# Patient Record
Sex: Female | Born: 1975
Health system: Southern US, Community
[De-identification: ages and names within clinical notes are randomized; demographics above are authoritative.]

## PROBLEM LIST (undated history)

## (undated) DIAGNOSIS — T7840XA Allergy, unspecified, initial encounter: Secondary | ICD-10-CM

## (undated) DIAGNOSIS — G4730 Sleep apnea, unspecified: Secondary | ICD-10-CM

## (undated) DIAGNOSIS — E7800 Pure hypercholesterolemia, unspecified: Secondary | ICD-10-CM

## (undated) DIAGNOSIS — E119 Type 2 diabetes mellitus without complications: Secondary | ICD-10-CM

## (undated) DIAGNOSIS — I10 Essential (primary) hypertension: Secondary | ICD-10-CM

## (undated) DIAGNOSIS — D573 Sickle-cell trait: Secondary | ICD-10-CM

## (undated) HISTORY — DX: Allergy, unspecified, initial encounter: T78.40XA

## (undated) HISTORY — DX: Sickle-cell trait: D57.3

## (undated) HISTORY — DX: Type 2 diabetes mellitus without complications: E11.9

## (undated) HISTORY — DX: Pure hypercholesterolemia, unspecified: E78.00

## (undated) HISTORY — PX: WISDOM TOOTH EXTRACTION: SHX21

## (undated) HISTORY — DX: Sleep apnea, unspecified: G47.30

---
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
## 1998-09-17 ENCOUNTER — Emergency Department (HOSPITAL_COMMUNITY): Admission: EM | Admit: 1998-09-17 | Discharge: 1998-09-17 | Payer: Self-pay | Admitting: Emergency Medicine

## 1998-09-23 ENCOUNTER — Other Ambulatory Visit: Admission: RE | Admit: 1998-09-23 | Discharge: 1998-09-23 | Payer: Self-pay | Admitting: Obstetrics and Gynecology

## 2000-08-24 ENCOUNTER — Encounter (HOSPITAL_BASED_OUTPATIENT_CLINIC_OR_DEPARTMENT_OTHER): Payer: Self-pay | Admitting: General Surgery

## 2000-08-24 ENCOUNTER — Encounter: Admission: RE | Admit: 2000-08-24 | Discharge: 2000-08-24 | Payer: Self-pay | Admitting: General Surgery

## 2001-02-22 ENCOUNTER — Encounter (HOSPITAL_BASED_OUTPATIENT_CLINIC_OR_DEPARTMENT_OTHER): Payer: Self-pay | Admitting: General Surgery

## 2001-02-22 ENCOUNTER — Encounter: Admission: RE | Admit: 2001-02-22 | Discharge: 2001-02-22 | Payer: Self-pay | Admitting: General Surgery

## 2001-08-29 ENCOUNTER — Other Ambulatory Visit: Admission: RE | Admit: 2001-08-29 | Discharge: 2001-08-29 | Payer: Self-pay | Admitting: Family Medicine

## 2002-01-23 ENCOUNTER — Encounter: Admission: RE | Admit: 2002-01-23 | Discharge: 2002-01-23 | Payer: Self-pay | Admitting: Family Medicine

## 2002-01-23 ENCOUNTER — Encounter: Payer: Self-pay | Admitting: Family Medicine

## 2008-08-04 ENCOUNTER — Emergency Department (HOSPITAL_COMMUNITY): Admission: EM | Admit: 2008-08-04 | Discharge: 2008-08-04 | Payer: Self-pay | Admitting: Emergency Medicine

## 2008-10-29 ENCOUNTER — Emergency Department (HOSPITAL_COMMUNITY): Admission: EM | Admit: 2008-10-29 | Discharge: 2008-10-29 | Payer: Self-pay | Admitting: Emergency Medicine

## 2009-07-10 ENCOUNTER — Inpatient Hospital Stay (HOSPITAL_COMMUNITY): Admission: AD | Admit: 2009-07-10 | Discharge: 2009-07-10 | Payer: Self-pay | Admitting: Obstetrics and Gynecology

## 2009-10-26 ENCOUNTER — Inpatient Hospital Stay (HOSPITAL_COMMUNITY): Admission: AD | Admit: 2009-10-26 | Discharge: 2009-10-26 | Payer: Self-pay | Admitting: Obstetrics and Gynecology

## 2009-12-05 ENCOUNTER — Inpatient Hospital Stay (HOSPITAL_COMMUNITY): Admission: AD | Admit: 2009-12-05 | Discharge: 2009-12-05 | Payer: Self-pay | Admitting: Obstetrics and Gynecology

## 2009-12-26 ENCOUNTER — Inpatient Hospital Stay (HOSPITAL_COMMUNITY): Admission: AD | Admit: 2009-12-26 | Discharge: 2009-12-29 | Payer: Self-pay | Admitting: Obstetrics and Gynecology

## 2010-05-11 ENCOUNTER — Emergency Department (HOSPITAL_COMMUNITY): Admission: EM | Admit: 2010-05-11 | Discharge: 2010-05-11 | Payer: Self-pay | Admitting: Family Medicine

## 2010-09-27 LAB — URINALYSIS, MICROSCOPIC ONLY
Bilirubin Urine: NEGATIVE
Leukocytes, UA: NEGATIVE
Nitrite: NEGATIVE
RBC / HPF: NONE SEEN RBC/hpf (ref <3)
Specific Gravity, Urine: 1.02 (ref 1.005–1.030)
Urobilinogen, UA: 0.2 mg/dL (ref 0.0–1.0)
pH: 6 (ref 5.0–8.0)

## 2010-09-27 LAB — COMPREHENSIVE METABOLIC PANEL
ALT: 15 U/L (ref 0–35)
AST: 16 U/L (ref 0–37)
Alkaline Phosphatase: 238 U/L — ABNORMAL HIGH (ref 39–117)
CO2: 23 mEq/L (ref 19–32)
Calcium: 9.1 mg/dL (ref 8.4–10.5)
Chloride: 107 mEq/L (ref 96–112)
GFR calc Af Amer: >60 mL/min (ref >60)
GFR calc non Af Amer: >60 mL/min (ref >60)
Glucose, Bld: 130 mg/dL — ABNORMAL HIGH (ref 70–99)
Sodium: 136 mEq/L (ref 135–145)
Total Bilirubin: 0.5 mg/dL (ref 0.3–1.2)

## 2010-09-27 LAB — CBC
HCT: 31 % — ABNORMAL LOW (ref 36.0–46.0)
HCT: 37.4 % (ref 36.0–46.0)
Hemoglobin: 12.1 g/dL (ref 12.0–15.0)
Hemoglobin: 12.9 g/dL (ref 12.0–15.0)
MCHC: 33.8 g/dL (ref 30.0–36.0)
MCHC: 33.8 g/dL (ref 30.0–36.0)
MCHC: 34.3 g/dL (ref 30.0–36.0)
MCV: 92.3 fL (ref 78.0–100.0)
Platelets: 101 10*3/uL — ABNORMAL LOW (ref 150–400)
Platelets: 112 10*3/uL — ABNORMAL LOW (ref 150–400)
Platelets: 73 10*3/uL — ABNORMAL LOW (ref 150–400)
RBC: 4.13 MIL/uL (ref 3.87–5.11)
RDW: 14.8 % (ref 11.5–15.5)
RDW: 14.9 % (ref 11.5–15.5)
RDW: 15 % (ref 11.5–15.5)
WBC: 12.2 10*3/uL — ABNORMAL HIGH (ref 4.0–10.5)
WBC: 12.4 10*3/uL — ABNORMAL HIGH (ref 4.0–10.5)
WBC: 15.8 10*3/uL — ABNORMAL HIGH (ref 4.0–10.5)

## 2010-09-27 LAB — LACTATE DEHYDROGENASE: LDH: 118 U/L (ref 94–250)

## 2010-09-28 LAB — URINALYSIS, ROUTINE W REFLEX MICROSCOPIC
Bilirubin Urine: NEGATIVE
Glucose, UA: NEGATIVE mg/dL
Hgb urine dipstick: NEGATIVE
Ketones, ur: NEGATIVE mg/dL
Nitrite: NEGATIVE
Protein, ur: NEGATIVE mg/dL
Specific Gravity, Urine: 1.01 (ref 1.005–1.030)
Urobilinogen, UA: 0.2 mg/dL (ref 0.0–1.0)
pH: 6.5 (ref 5.0–8.0)

## 2010-09-28 LAB — CBC
HCT: 35.6 % — ABNORMAL LOW (ref 36.0–46.0)
Hemoglobin: 12.2 g/dL (ref 12.0–15.0)
MCHC: 34.4 g/dL (ref 30.0–36.0)
MCV: 91.6 fL (ref 78.0–100.0)
Platelets: 110 K/uL — ABNORMAL LOW (ref 150–400)
RBC: 3.89 MIL/uL (ref 3.87–5.11)
RDW: 14.6 % (ref 11.5–15.5)
WBC: 10.3 K/uL (ref 4.0–10.5)

## 2010-09-28 LAB — COMPREHENSIVE METABOLIC PANEL
AST: 20 U/L (ref 0–37)
Albumin: 3 g/dL — ABNORMAL LOW (ref 3.5–5.2)
Calcium: 9.2 mg/dL (ref 8.4–10.5)
Creatinine, Ser: 0.55 mg/dL (ref 0.4–1.2)
GFR calc Af Amer: >60 mL/min (ref >60)
Sodium: 135 mEq/L (ref 135–145)
Total Protein: 5.9 g/dL — ABNORMAL LOW (ref 6.0–8.3)

## 2010-09-28 LAB — LACTATE DEHYDROGENASE: LDH: 109 U/L (ref 94–250)

## 2010-09-29 LAB — RH IMMUNE GLOBULIN WORKUP (NOT WOMEN'S HOSP)
ABO/RH(D): O NEG
Antibody Screen: NEGATIVE

## 2010-10-12 LAB — URINE CULTURE: Colony Count: 60000

## 2010-10-12 LAB — URINALYSIS, ROUTINE W REFLEX MICROSCOPIC
Glucose, UA: NEGATIVE mg/dL
Ketones, ur: NEGATIVE mg/dL
pH: 6.5 (ref 5.0–8.0)

## 2010-10-12 LAB — WET PREP, GENITAL: Yeast Wet Prep HPF POC: NONE SEEN

## 2010-10-12 LAB — CBC
HCT: 34.6 % — ABNORMAL LOW (ref 36.0–46.0)
Hemoglobin: 11.6 g/dL — ABNORMAL LOW (ref 12.0–15.0)
Platelets: 154 10*3/uL (ref 150–400)
RBC: 3.75 MIL/uL — ABNORMAL LOW (ref 3.87–5.11)
WBC: 11 10*3/uL — ABNORMAL HIGH (ref 4.0–10.5)

## 2010-10-12 LAB — ABO/RH: ABO/RH(D): O NEG

## 2010-10-12 LAB — RH IMMUNE GLOBULIN WORKUP (NOT WOMEN'S HOSP)

## 2010-10-12 LAB — GC/CHLAMYDIA PROBE AMP, GENITAL: GC Probe Amp, Genital: NEGATIVE

## 2010-10-12 LAB — TYPE AND SCREEN

## 2012-07-11 ENCOUNTER — Ambulatory Visit: Payer: Self-pay | Admitting: Obstetrics and Gynecology

## 2012-08-08 ENCOUNTER — Ambulatory Visit: Payer: BC Managed Care – PPO | Admitting: Obstetrics and Gynecology

## 2012-08-08 ENCOUNTER — Encounter: Payer: Self-pay | Admitting: Obstetrics and Gynecology

## 2012-08-08 VITALS — BP 118/74 | Ht 66.0 in | Wt 206.0 lb

## 2012-08-08 DIAGNOSIS — O1002 Pre-existing essential hypertension complicating childbirth: Secondary | ICD-10-CM | POA: Insufficient documentation

## 2012-08-08 DIAGNOSIS — F53 Postpartum depression: Secondary | ICD-10-CM

## 2012-08-08 DIAGNOSIS — Z01419 Encounter for gynecological examination (general) (routine) without abnormal findings: Secondary | ICD-10-CM

## 2012-08-08 NOTE — Progress Notes (Signed)
Last Pap: 04/21/2010 WNL: Yes Regular Periods:yes Contraception: none  Monthly Breast exam:yes Tetanus<34yrs:yes Nl.Bladder Function:yes Daily BMs:no Healthy Diet:yes Calcium:yes Mammogram:yes Date of Mammogram: 2002 Exercise:no Seatbelt: yes Abuse at home: no Stressful work:no PCP: Dr.Karimova  Change in PMH: unchanged Change in ZOX:[id number]  Pt stated no issues today.

## 2012-08-08 NOTE — Progress Notes (Signed)
Subjective:    Penny Williamson is a 37 y.o. female, G2P2002, who presents for an annual exam.   Patient reports:   Doing well.  PP depression resolved.  Not on birth control, but "being careful".  Wants to restart OCPs (past user), declines Depo due to increased weight gain with past use.    History   Social History  . Marital Status: Single    Spouse Name: N/A    Number of Children: N/A  . Years of Education: N/A   Social History Main Topics  . Smoking status: Current Every Day Smoker    Types: Cigarettes  . Smokeless tobacco: Never Used  . Alcohol Use: 1.5 oz/week    3 drink(s) per week  . Drug Use: No  . Sexually Active: Yes    Birth Control/ Protection: None   Other Topics Concern  . None   Social History Narrative  . None    Menstrual cycle:   LMP: Patient's last menstrual period was 07/01/2012.           Cycle: WNL, no IM bleeding  The following portions of the patient's history were reviewed and updated as appropriate: allergies, current medications, past family history, past medical history, past social history, past surgical history and problem list.  Review of Systems Pertinent items are noted in HPI. Breast:Negative for breast lump,nipple discharge or nipple retraction Gastrointestinal: Negative for abdominal pain, change in bowel habits or rectal bleeding Urinary:negative   Objective:    BP 118/74  Ht 5\' 6"  (1.676 m)  Wt 206 lb (93.441 kg)  BMI 33.25 kg/m2  LMP 07/01/2012    Weight:  Wt Readings from Last 1 Encounters:  08/08/12 206 lb (93.441 kg)          BMI: Body mass index is 33.25 kg/(m^2).  General Appearance: Alert, appropriate appearance for age. No acute distress HEENT: Grossly normal Neck / Thyroid: Supple, no masses, nodes or enlargement Lungs: clear to auscultation bilaterally Back: No CVA tenderness Breast Exam: No masses or nodes.No dimpling, nipple retraction or discharge. Cardiovascular: Regular rate and rhythm. S1, S2, no  murmur Gastrointestinal: Soft, non-tender, no masses or organomegaly Pelvic Exam: Vulva and vagina appear normal. Bimanual exam reveals normal uterus and adnexa. Rectovaginal: normal rectal, no masses Lymphatic Exam: Non-palpable nodes in neck, clavicular, axillary, or inguinal regions  Skin: no rash or abnormalities Neurologic: Normal gait and speech, no tremor  Psychiatric: Alert and oriented, appropriate affect.   Wet Prep:not applicable Urinalysis:not applicable UPT: Not done   Assessment:    Normal gyn exam Contraceptive management  Chronic hypertension--stable on HCTZ   Plan:    Mammogram: Age 16 Pap:  Done today STD screening: declined Contraception:no method--wants OCP trial.  3 months of Lo Loestrin 24 given.  Patient will f/u by phone in 3 months with update--may rx x 1 year if wants to continue. Other:  Continue to f/u with primary at [facility] at [facility] prn      Nyra Capes, [place]

## 2012-08-09 LAB — PAP IG W/ RFLX HPV ASCU

## 2014-05-13 ENCOUNTER — Encounter: Payer: Self-pay | Admitting: Obstetrics and Gynecology

## 2014-07-29 ENCOUNTER — Emergency Department (HOSPITAL_COMMUNITY)
Admission: EM | Admit: 2014-07-29 | Discharge: 2014-07-29 | Disposition: A | Discharge status: Home / Self Care | Payer: No Typology Code available for payment source | Attending: Emergency Medicine | Admitting: Emergency Medicine

## 2014-07-29 ENCOUNTER — Encounter (HOSPITAL_COMMUNITY): Payer: Self-pay | Admitting: *Deleted

## 2014-07-29 DIAGNOSIS — Z880 Allergy status to penicillin: Secondary | ICD-10-CM | POA: Insufficient documentation

## 2014-07-29 DIAGNOSIS — M79605 Pain in left leg: Secondary | ICD-10-CM | POA: Diagnosis present

## 2014-07-29 DIAGNOSIS — Z79899 Other long term (current) drug therapy: Secondary | ICD-10-CM | POA: Insufficient documentation

## 2014-07-29 DIAGNOSIS — I10 Essential (primary) hypertension: Secondary | ICD-10-CM | POA: Insufficient documentation

## 2014-07-29 DIAGNOSIS — Z720 Tobacco use: Secondary | ICD-10-CM | POA: Insufficient documentation

## 2014-07-29 DIAGNOSIS — M5432 Sciatica, left side: Secondary | ICD-10-CM | POA: Diagnosis not present

## 2014-07-29 HISTORY — DX: Essential (primary) hypertension: I10

## 2014-07-29 MED ORDER — OXYCODONE-ACETAMINOPHEN 5-325 MG PO TABS
2.0000 | ORAL_TABLET | Freq: Once | ORAL | Status: AC
  Administered 2014-07-29: 2 via ORAL
  Filled 2014-07-29: qty 2

## 2014-07-29 MED ORDER — OXYCODONE-ACETAMINOPHEN 5-325 MG PO TABS: 1.0000 | ORAL_TABLET | ORAL | Status: DC | PRN

## 2014-07-29 MED ORDER — KETOROLAC TROMETHAMINE 60 MG/2ML IM SOLN
60.0000 mg | Freq: Once | INTRAMUSCULAR | Status: AC
  Administered 2014-07-29: 60 mg via INTRAMUSCULAR
  Filled 2014-07-29: qty 2

## 2014-07-29 MED ORDER — IBUPROFEN 600 MG PO TABS: 600.0000 mg | ORAL_TABLET | Freq: Three times a day (TID) | ORAL | Status: DC | PRN

## 2014-07-29 MED ORDER — DIAZEPAM 5 MG PO TABS
5.0000 mg | ORAL_TABLET | Freq: Once | ORAL | Status: AC
  Administered 2014-07-29: 5 mg via ORAL
  Filled 2014-07-29: qty 1

## 2014-07-29 MED ORDER — METHOCARBAMOL 500 MG PO TABS: 500.0000 mg | ORAL_TABLET | Freq: Three times a day (TID) | ORAL | Status: DC | PRN

## 2014-07-29 NOTE — ED Notes (Signed)
Pt alert & oriented x4, stable gait. Patient given discharge instructions, paperwork & prescription(s). Patient  instructed to stop at the registration desk to finish any additional paperwork. Patient verbalized understanding. Pt left department w/ no further questions. 

## 2014-07-29 NOTE — ED Notes (Signed)
PB, Crackers and ginger ale provided; MD ok

## 2014-07-29 NOTE — ED Provider Notes (Signed)
CSN: [id number]     Arrival date & time 07/29/14  0803 History  This chart was scribed for Hoy Morn, MD by Edison Simon, ED Scribe. This patient was seen in room APA07/APA07 and the patient's care was started at 9:03 AM.    Chief Complaint  Patient presents with  . Leg Pain   The history is provided by the patient. No language interpreter was used.    HPI Comments: Penny Williamson is a 39 y.o. female who presents to the Emergency Department complaining of pain to left lower back radiating through buttock to left leg with onset 2 days ago. She reports associated intermittent numbness in toes on left foot. She reports similar pain before. She states she has not been evaluated for this before. States she has tried Ibuprofen and Tylenol without improvement. She denies trauma or injury. She denies weakness in her legs.  PCP: Milana Obey, MD at [facility]  Past Medical History  Diagnosis Date  . Allergy   . Hypertension    Past Surgical History  Procedure Laterality Date  . Wisdom tooth extraction     Family History  Problem Relation Age of Onset  . Lupus Mother   . Heart disease Maternal Grandmother   . Diabetes Maternal Grandmother    History  Substance Use Topics  . Smoking status: Current Every Day Smoker    Types: Cigarettes  . Smokeless tobacco: Never Used  . Alcohol Use: 1.5 oz/week    3 Not specified per week   OB History    Gravida Para Term Preterm AB TAB SAB Ectopic Multiple Living   2 2 2       2      Review of Systems A complete 10 system review of systems was obtained and all systems are negative except as noted in the HPI and PMH.    Allergies  Penicillins  Home Medications   Prior to Admission medications   Medication Sig Start Date End Date Taking? Authorizing Provider  triamterene-hydrochlorothiazide (DYAZIDE) 37.5-25 MG per capsule Take 1 capsule by mouth daily. 07/02/14  Yes Historical Provider, MD   BP 130/87 mmHg  Pulse 104  Temp(Src) 97.9  F (36.6 C) (Oral)  Resp 18  Ht 5' 5.5" (1.664 m)  Wt 170 lb (77.111 kg)  BMI 27.85 kg/m2  SpO2 99%  LMP 07/29/2014 Physical Exam  Constitutional: She is oriented to person, place, and time. She appears well-developed and well-nourished.  HENT:  Head: Normocephalic.  Eyes: EOM are normal.  Neck: Normal range of motion.  Pulmonary/Chest: Effort normal.  Abdominal: She exhibits no distension.  Musculoskeletal: Normal range of motion.  Mild paralumbar tenderness on left Tenderness in left sciatic groove normal pulses left foot  Neurological: She is alert and oriented to person, place, and time.  Psychiatric: She has a normal mood and affect.  Nursing note and vitals reviewed.   ED Course  Procedures (including critical care time)  DIAGNOSTIC STUDIES: Oxygen Saturation is 99% on room air, normal by my interpretation.    COORDINATION OF CARE: 9:06 AM Discussed treatment plan with patient at beside, the patient agrees with the plan and has no further questions at this time.   Labs Review Labs Reviewed - No data to display  Imaging Review No results found.   EKG Interpretation None      MDM   Final diagnoses:  Sciatica, left    Symptoms seem consistent with left-sided sciatica.  Normal pulses in left foot.  Full range  of motion.  Doubt septic arthritis.  Doubt spinal epidural abscess.  No indication for advanced imaging here in the emergency department.  Improved while here with pain medication and muscle relaxants.  Primary care follow-up.  I personally performed the services described in this documentation, which was scribed in my presence. The recorded information has been reviewed and is accurate.      Hoy Morn, MD 07/29/14 [id number]

## 2014-07-29 NOTE — ED Notes (Signed)
Pt states pain started Saturday in lt hip, extends to toes, has numbness in toes on lt foot that comes and goes.

## 2016-01-15 ENCOUNTER — Other Ambulatory Visit: Payer: Self-pay | Admitting: Family Medicine

## 2016-01-15 ENCOUNTER — Other Ambulatory Visit (HOSPITAL_COMMUNITY)
Admission: RE | Admit: 2016-01-15 | Discharge: 2016-01-15 | Disposition: A | Discharge status: Home / Self Care | Payer: 59 | Source: Ambulatory Visit | Attending: Family Medicine | Admitting: Family Medicine

## 2016-01-15 DIAGNOSIS — Z1151 Encounter for screening for human papillomavirus (HPV): Secondary | ICD-10-CM | POA: Diagnosis not present

## 2016-01-15 DIAGNOSIS — Z113 Encounter for screening for infections with a predominantly sexual mode of transmission: Secondary | ICD-10-CM | POA: Insufficient documentation

## 2016-01-15 DIAGNOSIS — Z01419 Encounter for gynecological examination (general) (routine) without abnormal findings: Secondary | ICD-10-CM | POA: Diagnosis present

## 2016-01-15 DIAGNOSIS — N760 Acute vaginitis: Secondary | ICD-10-CM | POA: Diagnosis present

## 2016-01-19 LAB — CYTOLOGY - PAP

## 2016-02-26 ENCOUNTER — Encounter (HOSPITAL_COMMUNITY): Payer: Self-pay

## 2016-02-26 ENCOUNTER — Inpatient Hospital Stay (HOSPITAL_COMMUNITY)
Admission: AD | Admit: 2016-02-26 | Discharge: 2016-02-26 | Disposition: A | Discharge status: Home / Self Care | Payer: Managed Care, Other (non HMO) | Source: Ambulatory Visit | Attending: Obstetrics and Gynecology | Admitting: Obstetrics and Gynecology

## 2016-02-26 DIAGNOSIS — N764 Abscess of vulva: Secondary | ICD-10-CM

## 2016-02-26 DIAGNOSIS — F17210 Nicotine dependence, cigarettes, uncomplicated: Secondary | ICD-10-CM | POA: Insufficient documentation

## 2016-02-26 DIAGNOSIS — L0292 Furuncle, unspecified: Secondary | ICD-10-CM | POA: Diagnosis present

## 2016-02-26 DIAGNOSIS — I10 Essential (primary) hypertension: Secondary | ICD-10-CM | POA: Insufficient documentation

## 2016-02-26 LAB — POCT PREGNANCY, URINE: Preg Test, Ur: NEGATIVE

## 2016-02-26 LAB — URINALYSIS, ROUTINE W REFLEX MICROSCOPIC
Bilirubin Urine: NEGATIVE
Glucose, UA: NEGATIVE mg/dL
Hgb urine dipstick: NEGATIVE
Ketones, ur: 15 mg/dL — AB
LEUKOCYTES UA: NEGATIVE
Nitrite: NEGATIVE
Protein, ur: NEGATIVE mg/dL
SPECIFIC GRAVITY, URINE: 1.02 (ref 1.005–1.030)
pH: 5.5 (ref 5.0–8.0)

## 2016-02-26 MED ORDER — TRAMADOL HCL 50 MG PO TABS: 50.0000 mg | ORAL_TABLET | Freq: Four times a day (QID) | ORAL | 0 refills | Status: AC | PRN

## 2016-02-26 MED ORDER — LIDOCAINE 0.5 % EX GEL: 1.0000 "application " | Freq: Two times a day (BID) | CUTANEOUS | 0 refills | Status: DC | PRN

## 2016-02-26 MED ORDER — CLINDAMYCIN HCL 150 MG PO CAPS: 300.0000 mg | ORAL_CAPSULE | Freq: Four times a day (QID) | ORAL | 0 refills | Status: DC

## 2016-02-26 MED ORDER — LIDOCAINE 5 % EX OINT
TOPICAL_OINTMENT | Freq: Once | CUTANEOUS | Status: AC
  Administered 2016-02-26: 13:00:00 via TOPICAL
  Filled 2016-02-26: qty 35.44

## 2016-02-26 MED ORDER — KETOROLAC TROMETHAMINE 60 MG/2ML IM SOLN
60.0000 mg | Freq: Once | INTRAMUSCULAR | Status: AC
  Administered 2016-02-26: 60 mg via INTRAMUSCULAR
  Filled 2016-02-26: qty 2

## 2016-02-26 NOTE — MAU Note (Signed)
Pt states she has a boil on her labia for 2 weeks, was seen by PCP.  Was told to see GYN if it did not improve with compresses, has been unable to get an appointment with her GYN.  Swelling & pain is much worse.

## 2016-02-26 NOTE — MAU Provider Note (Signed)
Chief Complaint:  vaginal boil   First Provider Initiated Contact with Patient 02/26/16 1214     HPI:   Penny Williamson is a 40 y.o. [id number] who presents to maternity admissions reporting Right labial swelling and pain.  Started about 2-3 weeks ago.  Has gotten bigger over time.  Has been doing warm soaks.  Unable to get into office, so came here. She reports vaginal bleeding (menses), but no vaginal itching/burning, urinary symptoms, h/a, dizziness, n/v, or fever/chills.    Vaginal Pain  The patient's primary symptoms include vaginal bleeding. The patient's pertinent negatives include no genital itching, genital lesions, genital odor or pelvic pain. This is a new problem. The current episode started 1 to 4 weeks ago. The problem occurs constantly. The problem has been unchanged. The pain is moderate. The problem affects the right side. She is not pregnant. Pertinent negatives include no abdominal pain, back pain, chills, constipation, diarrhea, fever, nausea or vomiting. The vaginal discharge was bloody. The vaginal bleeding is typical of menses. She has not been passing clots. She has not been passing tissue. The symptoms are aggravated by tactile pressure. She has tried warm baths for the symptoms. The treatment provided no relief.    RN Note: Pt states she has a boil on her labia for 2 weeks, was seen by PCP.  Was told to see GYN if it did not improve with compresses, has been unable to get an appointment with her GYN.  Swelling & pain is much worse.  Past Medical History: Past Medical History:  Diagnosis Date  . Allergy   . Hypertension     Past obstetric history: OB History  Gravida Para Term Preterm AB Living  2 2 2     2   SAB TAB Ectopic Multiple Live Births          2    # Outcome Date GA Lbr Len/2nd Weight Sex Delivery Anes PTL Lv  2 Term 12/2009    F    LIV  1 Term 08/1994    F    LIV      Past Surgical History: Past Surgical History:  Procedure Laterality Date  .  WISDOM TOOTH EXTRACTION      Family History: Family History  Problem Relation Age of Onset  . Lupus Mother   . Heart disease Maternal Grandmother   . Diabetes Maternal Grandmother     Social History: Social History  Substance Use Topics  . Smoking status: Current Every Day Smoker    Types: Cigarettes  . Smokeless tobacco: Never Used  . Alcohol use 1.5 oz/week    3 Standard drinks or equivalent per week    Allergies:  Allergies  Allergen Reactions  . Penicillins     Meds:  Prescriptions Prior to Admission  Medication Sig Dispense Refill Last Dose  . ibuprofen (ADVIL,MOTRIN) 600 MG tablet Take 1 tablet (600 mg total) by mouth every 8 (eight) hours as needed. 15 tablet 0   . methocarbamol (ROBAXIN) 500 MG tablet Take 1 tablet (500 mg total) by mouth every 8 (eight) hours as needed for muscle spasms. 20 tablet 0   . oxyCODONE-acetaminophen (PERCOCET/ROXICET) 5-325 MG per tablet Take 1 tablet by mouth every 4 (four) hours as needed for severe pain. 15 tablet 0   . triamterene-hydrochlorothiazide (DYAZIDE) 37.5-25 MG per capsule Take 1 capsule by mouth daily.  3 07/28/2014 at Unknown time    I have reviewed patient's Past Medical Hx, Surgical Hx, Family Hx, Social  Hx, medications and allergies.  ROS:  Review of Systems  Constitutional: Negative for chills and fever.  Gastrointestinal: Negative for abdominal pain, constipation, diarrhea, nausea and vomiting.  Genitourinary: Positive for vaginal bleeding and vaginal pain. Negative for pelvic pain.       Swelling right labia   Musculoskeletal: Negative for back pain.   Other systems negative     Physical Exam  Patient Vitals for the past 24 hrs:  BP Temp Temp src Pulse Resp  02/26/16 1154 138/84 98.5 F (36.9 C) Oral 118 18   Constitutional: Well-developed, well-nourished female in no acute distress.  Cardiovascular: normal rate and rhythm Respiratory: normal effort, no distress.  GI: Abd soft, non-tender.   Nondistended.  No rebound, No guarding.   MS: Extremities nontender, no edema, normal ROM Neurologic: Alert and oriented x 4.   Grossly nonfocal. GU: Neg CVAT. Skin:  Warm and Dry Psych:  Affect appropriate.  PELVIC EXAM:  Right labial abscess Fluctuant in middle of area Cellulitis tracks upward into mons area No obvious pointing    Labs: Results for orders placed or performed during the hospital encounter of 02/26/16 (from the past 24 hour(s))  Urinalysis, Routine w reflex microscopic (not at [facility])     Status: Abnormal   Collection Time: 02/26/16 12:00 PM  Result Value Ref Range   Color, Urine YELLOW YELLOW   APPearance CLEAR CLEAR   Specific Gravity, Urine 1.020 1.005 - 1.030   pH 5.5 5.0 - 8.0   Glucose, UA NEGATIVE NEGATIVE mg/dL   Hgb urine dipstick NEGATIVE NEGATIVE   Bilirubin Urine NEGATIVE NEGATIVE   Ketones, ur 15 (A) NEGATIVE mg/dL   Protein, ur NEGATIVE NEGATIVE mg/dL   Nitrite NEGATIVE NEGATIVE   Leukocytes, UA NEGATIVE NEGATIVE  Pregnancy, urine POC     Status: None   Collection Time: 02/26/16 12:07 PM  Result Value Ref Range   Preg Test, Ur NEGATIVE NEGATIVE    Imaging:  No results found.  MAU Course/MDM: I have ordered labs as follows: UA Imaging ordered: none Results reviewed.   Consult Dr Charlesetta Garibaldi with history, presentation and exam findings. She recommends Clindamycin and analgesic with followup in office. States she will have someone from office call patient.   Treatments in MAU included Toradol for pain and topical xylocaine..   Pt stable at time of discharge.  Assessment: RIght labial abscess Possible cellulitis into mons  Plan: Discharge home Recommend warm soaks Rx sent for Clindamycin for infection Rx given for Tramadol for pain Rx sent for Xylocaine for pain Followup in office for MD evaluation  Encouraged to return here or to other Urgent Care/ED if she develops worsening of symptoms, increase in pain, fever, or other concerning  symptoms.   Hansel Feinstein CNM, MSN Certified Nurse-Midwife 02/26/2016 12:28 PM

## 2016-02-26 NOTE — Discharge Instructions (Signed)

## 2016-03-22 ENCOUNTER — Other Ambulatory Visit: Payer: Self-pay | Admitting: Obstetrics & Gynecology

## 2016-03-22 DIAGNOSIS — R928 Other abnormal and inconclusive findings on diagnostic imaging of breast: Secondary | ICD-10-CM

## 2016-03-25 ENCOUNTER — Ambulatory Visit
Admission: RE | Admit: 2016-03-25 | Discharge: 2016-03-25 | Disposition: A | Discharge status: Home / Self Care | Payer: Managed Care, Other (non HMO) | Source: Ambulatory Visit | Attending: Obstetrics & Gynecology | Admitting: Obstetrics & Gynecology

## 2016-03-25 DIAGNOSIS — R928 Other abnormal and inconclusive findings on diagnostic imaging of breast: Secondary | ICD-10-CM

## 2016-09-07 ENCOUNTER — Other Ambulatory Visit: Payer: Self-pay | Admitting: Obstetrics & Gynecology

## 2016-09-07 DIAGNOSIS — N63 Unspecified lump in unspecified breast: Secondary | ICD-10-CM

## 2016-09-21 ENCOUNTER — Ambulatory Visit
Admission: RE | Admit: 2016-09-21 | Discharge: 2016-09-21 | Disposition: A | Discharge status: Home / Self Care | Payer: Managed Care, Other (non HMO) | Source: Ambulatory Visit | Attending: Obstetrics & Gynecology | Admitting: Obstetrics & Gynecology

## 2016-09-21 DIAGNOSIS — N63 Unspecified lump in unspecified breast: Secondary | ICD-10-CM

## 2017-05-18 DIAGNOSIS — M79661 Pain in right lower leg: Secondary | ICD-10-CM | POA: Diagnosis not present

## 2017-05-19 ENCOUNTER — Other Ambulatory Visit: Payer: Self-pay | Admitting: Family Medicine

## 2017-05-23 ENCOUNTER — Other Ambulatory Visit: Payer: Self-pay | Admitting: Family Medicine

## 2017-05-23 DIAGNOSIS — M79604 Pain in right leg: Secondary | ICD-10-CM

## 2017-05-25 ENCOUNTER — Other Ambulatory Visit: Payer: Self-pay | Admitting: Obstetrics & Gynecology

## 2017-05-25 DIAGNOSIS — N6320 Unspecified lump in the left breast, unspecified quadrant: Secondary | ICD-10-CM

## 2017-05-25 DIAGNOSIS — Z1231 Encounter for screening mammogram for malignant neoplasm of breast: Secondary | ICD-10-CM

## 2017-05-26 ENCOUNTER — Other Ambulatory Visit: Payer: Managed Care, Other (non HMO)

## 2017-05-31 ENCOUNTER — Ambulatory Visit
Admission: RE | Admit: 2017-05-31 | Discharge: 2017-05-31 | Disposition: A | Discharge status: Home / Self Care | Payer: 59 | Source: Ambulatory Visit | Attending: Family Medicine | Admitting: Family Medicine

## 2017-05-31 ENCOUNTER — Other Ambulatory Visit: Payer: Self-pay | Admitting: Obstetrics & Gynecology

## 2017-05-31 ENCOUNTER — Ambulatory Visit
Admission: RE | Admit: 2017-05-31 | Discharge: 2017-05-31 | Disposition: A | Discharge status: Home / Self Care | Payer: 59 | Source: Ambulatory Visit | Attending: Obstetrics & Gynecology | Admitting: Obstetrics & Gynecology

## 2017-05-31 DIAGNOSIS — N6310 Unspecified lump in the right breast, unspecified quadrant: Secondary | ICD-10-CM | POA: Diagnosis not present

## 2017-05-31 DIAGNOSIS — Z1231 Encounter for screening mammogram for malignant neoplasm of breast: Secondary | ICD-10-CM

## 2017-05-31 DIAGNOSIS — N6320 Unspecified lump in the left breast, unspecified quadrant: Secondary | ICD-10-CM

## 2017-05-31 DIAGNOSIS — M79604 Pain in right leg: Secondary | ICD-10-CM

## 2017-05-31 DIAGNOSIS — R922 Inconclusive mammogram: Secondary | ICD-10-CM | POA: Diagnosis not present

## 2017-05-31 DIAGNOSIS — M79661 Pain in right lower leg: Secondary | ICD-10-CM | POA: Diagnosis not present

## 2017-06-07 ENCOUNTER — Other Ambulatory Visit: Payer: Self-pay | Admitting: Obstetrics & Gynecology

## 2017-06-07 ENCOUNTER — Ambulatory Visit
Admission: RE | Admit: 2017-06-07 | Discharge: 2017-06-07 | Disposition: A | Discharge status: Home / Self Care | Payer: 59 | Source: Ambulatory Visit | Attending: Obstetrics & Gynecology | Admitting: Obstetrics & Gynecology

## 2017-06-07 DIAGNOSIS — N6310 Unspecified lump in the right breast, unspecified quadrant: Secondary | ICD-10-CM

## 2017-06-07 DIAGNOSIS — N6312 Unspecified lump in the right breast, upper inner quadrant: Secondary | ICD-10-CM | POA: Diagnosis not present

## 2017-06-22 ENCOUNTER — Ambulatory Visit: Payer: Self-pay

## 2017-12-23 DIAGNOSIS — I10 Essential (primary) hypertension: Secondary | ICD-10-CM | POA: Diagnosis not present

## 2017-12-23 DIAGNOSIS — R7303 Prediabetes: Secondary | ICD-10-CM | POA: Diagnosis not present

## 2017-12-29 ENCOUNTER — Other Ambulatory Visit: Payer: Self-pay | Admitting: Family Medicine

## 2017-12-29 ENCOUNTER — Other Ambulatory Visit (HOSPITAL_COMMUNITY)
Admission: RE | Admit: 2017-12-29 | Discharge: 2017-12-29 | Disposition: A | Discharge status: Home / Self Care | Payer: 59 | Source: Ambulatory Visit | Attending: Family Medicine | Admitting: Family Medicine

## 2017-12-29 DIAGNOSIS — D696 Thrombocytopenia, unspecified: Secondary | ICD-10-CM | POA: Diagnosis not present

## 2017-12-29 DIAGNOSIS — Z0000 Encounter for general adult medical examination without abnormal findings: Secondary | ICD-10-CM | POA: Diagnosis not present

## 2017-12-29 DIAGNOSIS — R87618 Other abnormal cytological findings on specimens from cervix uteri: Secondary | ICD-10-CM | POA: Diagnosis not present

## 2017-12-29 DIAGNOSIS — Z124 Encounter for screening for malignant neoplasm of cervix: Secondary | ICD-10-CM | POA: Insufficient documentation

## 2017-12-29 DIAGNOSIS — M79675 Pain in left toe(s): Secondary | ICD-10-CM | POA: Diagnosis not present

## 2017-12-29 DIAGNOSIS — R7303 Prediabetes: Secondary | ICD-10-CM | POA: Diagnosis not present

## 2017-12-29 DIAGNOSIS — I10 Essential (primary) hypertension: Secondary | ICD-10-CM | POA: Diagnosis not present

## 2018-01-03 LAB — CYTOLOGY - PAP
DIAGNOSIS: NEGATIVE
HPV: DETECTED — AB

## 2018-01-10 ENCOUNTER — Other Ambulatory Visit: Payer: Self-pay

## 2018-01-10 ENCOUNTER — Encounter: Payer: Self-pay | Admitting: Podiatry

## 2018-01-10 ENCOUNTER — Ambulatory Visit: Payer: 59 | Admitting: Podiatry

## 2018-01-10 VITALS — BP 129/80 | HR 91 | Resp 16

## 2018-01-10 DIAGNOSIS — L600 Ingrowing nail: Secondary | ICD-10-CM | POA: Diagnosis not present

## 2018-01-10 DIAGNOSIS — I10 Essential (primary) hypertension: Secondary | ICD-10-CM | POA: Insufficient documentation

## 2018-01-10 NOTE — Patient Instructions (Signed)

## 2018-01-11 NOTE — Progress Notes (Signed)
  Subjective:  Patient ID: Penny Williamson, female    DOB: 1975/12/05,  MRN: [id number] HPI Chief Complaint  Patient presents with  . Toe Pain    Hallux nails bilateral (L>R) - throbbing around toenail x weeks, tried trimming the corners and soaking-no help  . New Patient (Initial Visit)    42 y.o. female presents with the above complaint.   ROS: Denies fever chills nausea vomiting muscle aches pains calf pain back pain chest pain shortness of breath.  Past Medical History:  Diagnosis Date  . Allergy   . Hypertension    Past Surgical History:  Procedure Laterality Date  . WISDOM TOOTH EXTRACTION      Current Outpatient Medications:  .  metFORMIN (GLUCOPHAGE) 500 MG tablet, Take by mouth 2 (two) times daily with a meal., Disp: , Rfl:  .  triamterene-hydrochlorothiazide (DYAZIDE) 37.5-25 MG per capsule, Take 1 capsule by mouth daily., Disp: , Rfl: 3  Allergies  Allergen Reactions  . Penicillins    Review of Systems Objective:   Vitals:   01/10/18 1521  BP: 129/80  Pulse: 91  Resp: 16    General: Well developed, nourished, in no acute distress, alert and oriented x3   Dermatological: Skin is warm, dry and supple bilateral. Nails x 10 are well maintained; remaining integument appears unremarkable at this time. There are no open sores, no preulcerative lesions, no rash or signs of infection present.  Vascular: Dorsalis Pedis artery and Posterior Tibial artery pedal pulses are 2/4 bilateral with immedate capillary fill time. Pedal hair growth present. No varicosities and no lower extremity edema present bilateral.   Neruologic: Grossly intact via light touch bilateral. Vibratory intact via tuning fork bilateral. Protective threshold with Semmes Wienstein monofilament intact to all pedal sites bilateral. Patellar and Achilles deep tendon reflexes 2+ bilateral. No Babinski or clonus noted bilateral.   Musculoskeletal: No gross boney pedal deformities bilateral. No pain,  crepitus, or limitation noted with foot and ankle range of motion bilateral. Muscular strength 5/5 in all groups tested bilateral.  Gait: Unassisted, Nonantalgic.    Radiographs:  None taken  Assessment & Plan:   Assessment: Ingrown toenail tibial border hallux left exquisitely painful.  Plan: After local anesthetic was injected a total of 3 cc of a 50-50 mixture of Marcaine plain and lidocaine plain about the hallux left a chemical matrixectomy was performed which she tolerated well.  She received both oral and written home-going instruction for care and soaking of her toe as well as a prescription for Cortisporin Otic to be applied twice daily after soaking.  I will follow-up with her in 1 to 2 weeks if she is not improved the next time we see her a lateral x-ray to rule out a subungual exostosis will be necessary.     Hiroki Wint T. [place], [place]

## 2018-01-24 ENCOUNTER — Ambulatory Visit (INDEPENDENT_AMBULATORY_CARE_PROVIDER_SITE_OTHER): Payer: 59

## 2018-01-24 DIAGNOSIS — L600 Ingrowing nail: Secondary | ICD-10-CM

## 2018-01-26 NOTE — Progress Notes (Signed)
Patient presents s/p left hallux partial nail border avulsion on 01/10/18. She says that she is doing well, not having any pain in her toe.   Noted well healing surgical sites, no erythema, no redness, no drainage, no swelling. Advised to d/c soaking and follow up with any acute symptom changes

## 2018-01-29 DIAGNOSIS — D249 Benign neoplasm of unspecified breast: Secondary | ICD-10-CM | POA: Insufficient documentation

## 2018-02-09 DIAGNOSIS — Z8759 Personal history of other complications of pregnancy, childbirth and the puerperium: Secondary | ICD-10-CM | POA: Insufficient documentation

## 2018-02-09 DIAGNOSIS — R87619 Unspecified abnormal cytological findings in specimens from cervix uteri: Secondary | ICD-10-CM | POA: Insufficient documentation

## 2018-02-09 DIAGNOSIS — Z880 Allergy status to penicillin: Secondary | ICD-10-CM | POA: Insufficient documentation

## 2018-02-14 ENCOUNTER — Other Ambulatory Visit: Payer: Self-pay | Admitting: Obstetrics and Gynecology

## 2018-02-14 DIAGNOSIS — R69 Illness, unspecified: Secondary | ICD-10-CM | POA: Diagnosis not present

## 2018-02-14 DIAGNOSIS — R87619 Unspecified abnormal cytological findings in specimens from cervix uteri: Secondary | ICD-10-CM | POA: Diagnosis not present

## 2018-03-06 DIAGNOSIS — Z0000 Encounter for general adult medical examination without abnormal findings: Secondary | ICD-10-CM | POA: Diagnosis not present

## 2018-03-06 DIAGNOSIS — I10 Essential (primary) hypertension: Secondary | ICD-10-CM | POA: Diagnosis not present

## 2018-03-06 DIAGNOSIS — M79675 Pain in left toe(s): Secondary | ICD-10-CM | POA: Diagnosis not present

## 2018-03-06 DIAGNOSIS — Z124 Encounter for screening for malignant neoplasm of cervix: Secondary | ICD-10-CM | POA: Diagnosis not present

## 2018-03-06 DIAGNOSIS — E785 Hyperlipidemia, unspecified: Secondary | ICD-10-CM | POA: Diagnosis not present

## 2018-03-06 DIAGNOSIS — D696 Thrombocytopenia, unspecified: Secondary | ICD-10-CM | POA: Diagnosis not present

## 2018-03-06 DIAGNOSIS — R87618 Other abnormal cytological findings on specimens from cervix uteri: Secondary | ICD-10-CM | POA: Diagnosis not present

## 2018-07-27 DIAGNOSIS — I10 Essential (primary) hypertension: Secondary | ICD-10-CM | POA: Diagnosis not present

## 2018-07-27 DIAGNOSIS — E785 Hyperlipidemia, unspecified: Secondary | ICD-10-CM | POA: Diagnosis not present

## 2018-08-22 ENCOUNTER — Other Ambulatory Visit: Payer: Self-pay | Admitting: Family Medicine

## 2018-08-22 DIAGNOSIS — E785 Hyperlipidemia, unspecified: Secondary | ICD-10-CM | POA: Diagnosis not present

## 2018-08-22 DIAGNOSIS — R7303 Prediabetes: Secondary | ICD-10-CM | POA: Diagnosis not present

## 2018-08-22 DIAGNOSIS — I10 Essential (primary) hypertension: Secondary | ICD-10-CM | POA: Diagnosis not present

## 2018-08-28 DIAGNOSIS — N6452 Nipple discharge: Secondary | ICD-10-CM | POA: Diagnosis not present

## 2018-08-30 ENCOUNTER — Other Ambulatory Visit: Payer: Self-pay | Admitting: Family Medicine

## 2018-08-30 DIAGNOSIS — N6452 Nipple discharge: Secondary | ICD-10-CM

## 2018-09-08 ENCOUNTER — Other Ambulatory Visit: Payer: Self-pay | Admitting: Family Medicine

## 2018-09-08 ENCOUNTER — Ambulatory Visit
Admission: RE | Admit: 2018-09-08 | Discharge: 2018-09-08 | Disposition: A | Discharge status: Home / Self Care | Payer: 59 | Source: Ambulatory Visit | Attending: Family Medicine | Admitting: Family Medicine

## 2018-09-08 DIAGNOSIS — N6310 Unspecified lump in the right breast, unspecified quadrant: Secondary | ICD-10-CM

## 2018-09-08 DIAGNOSIS — N6452 Nipple discharge: Secondary | ICD-10-CM

## 2018-09-08 DIAGNOSIS — R928 Other abnormal and inconclusive findings on diagnostic imaging of breast: Secondary | ICD-10-CM | POA: Diagnosis not present

## 2019-01-01 DIAGNOSIS — N760 Acute vaginitis: Secondary | ICD-10-CM | POA: Diagnosis not present

## 2019-01-30 DIAGNOSIS — R7303 Prediabetes: Secondary | ICD-10-CM | POA: Diagnosis not present

## 2019-01-30 DIAGNOSIS — I10 Essential (primary) hypertension: Secondary | ICD-10-CM | POA: Diagnosis not present

## 2019-01-30 DIAGNOSIS — Z720 Tobacco use: Secondary | ICD-10-CM | POA: Diagnosis not present

## 2019-01-30 DIAGNOSIS — E785 Hyperlipidemia, unspecified: Secondary | ICD-10-CM | POA: Diagnosis not present

## 2019-02-21 DIAGNOSIS — H52223 Regular astigmatism, bilateral: Secondary | ICD-10-CM | POA: Diagnosis not present

## 2019-02-28 DIAGNOSIS — R7303 Prediabetes: Secondary | ICD-10-CM | POA: Diagnosis not present

## 2019-02-28 DIAGNOSIS — I10 Essential (primary) hypertension: Secondary | ICD-10-CM | POA: Diagnosis not present

## 2019-02-28 DIAGNOSIS — E785 Hyperlipidemia, unspecified: Secondary | ICD-10-CM | POA: Diagnosis not present

## 2019-02-28 DIAGNOSIS — Z720 Tobacco use: Secondary | ICD-10-CM | POA: Diagnosis not present

## 2019-03-05 DIAGNOSIS — R0683 Snoring: Secondary | ICD-10-CM | POA: Diagnosis not present

## 2019-03-05 DIAGNOSIS — G4719 Other hypersomnia: Secondary | ICD-10-CM | POA: Diagnosis not present

## 2019-03-05 DIAGNOSIS — R0681 Apnea, not elsewhere classified: Secondary | ICD-10-CM | POA: Diagnosis not present

## 2019-03-31 DIAGNOSIS — G4733 Obstructive sleep apnea (adult) (pediatric): Secondary | ICD-10-CM | POA: Diagnosis not present

## 2019-04-02 DIAGNOSIS — G4733 Obstructive sleep apnea (adult) (pediatric): Secondary | ICD-10-CM | POA: Diagnosis not present

## 2019-04-13 DIAGNOSIS — G4733 Obstructive sleep apnea (adult) (pediatric): Secondary | ICD-10-CM | POA: Diagnosis not present

## 2019-05-14 DIAGNOSIS — G4733 Obstructive sleep apnea (adult) (pediatric): Secondary | ICD-10-CM | POA: Diagnosis not present

## 2019-06-13 DIAGNOSIS — G4733 Obstructive sleep apnea (adult) (pediatric): Secondary | ICD-10-CM | POA: Diagnosis not present

## 2019-06-26 DIAGNOSIS — R519 Headache, unspecified: Secondary | ICD-10-CM | POA: Diagnosis not present

## 2019-06-26 DIAGNOSIS — R509 Fever, unspecified: Secondary | ICD-10-CM | POA: Diagnosis not present

## 2019-06-26 DIAGNOSIS — R0602 Shortness of breath: Secondary | ICD-10-CM | POA: Diagnosis not present

## 2019-06-26 DIAGNOSIS — R05 Cough: Secondary | ICD-10-CM | POA: Diagnosis not present

## 2019-06-26 DIAGNOSIS — Z20828 Contact with and (suspected) exposure to other viral communicable diseases: Secondary | ICD-10-CM | POA: Diagnosis not present

## 2019-06-26 DIAGNOSIS — B349 Viral infection, unspecified: Secondary | ICD-10-CM | POA: Diagnosis not present

## 2019-07-14 DIAGNOSIS — G4733 Obstructive sleep apnea (adult) (pediatric): Secondary | ICD-10-CM | POA: Diagnosis not present

## 2019-08-14 DIAGNOSIS — G4733 Obstructive sleep apnea (adult) (pediatric): Secondary | ICD-10-CM | POA: Diagnosis not present

## 2019-08-31 DIAGNOSIS — G4733 Obstructive sleep apnea (adult) (pediatric): Secondary | ICD-10-CM | POA: Diagnosis not present

## 2019-09-11 DIAGNOSIS — G4733 Obstructive sleep apnea (adult) (pediatric): Secondary | ICD-10-CM | POA: Diagnosis not present

## 2019-09-19 ENCOUNTER — Other Ambulatory Visit: Payer: Self-pay | Admitting: Family Medicine

## 2019-09-19 DIAGNOSIS — Z1231 Encounter for screening mammogram for malignant neoplasm of breast: Secondary | ICD-10-CM

## 2019-09-22 ENCOUNTER — Ambulatory Visit: Payer: 59 | Attending: Internal Medicine

## 2019-09-22 DIAGNOSIS — Z23 Encounter for immunization: Secondary | ICD-10-CM

## 2019-09-22 NOTE — Progress Notes (Signed)
   Covid-19 Vaccination Clinic  Name:  Penny Williamson    MRN: [id number] DOB: 1976-04-04  09/22/2019  [name] was observed post Covid-19 immunization for 15 minutes without incident. She was provided with Vaccine Information Sheet and instruction to access the V-Safe system.   [name] was instructed to call 911 with any severe reactions post vaccine: Marland Kitchen Difficulty breathing  . Swelling of face and throat  . A fast heartbeat  . A bad rash all over body  . Dizziness and weakness   Immunizations Administered    Name Date Dose VIS Date Route   Pfizer COVID-19 Vaccine 09/22/2019  9:40 AM 0.3 mL 06/22/2019 Intramuscular   Manufacturer: [place]   Lot: [id number]   [place]: [id number]

## 2019-09-28 DIAGNOSIS — G4733 Obstructive sleep apnea (adult) (pediatric): Secondary | ICD-10-CM | POA: Diagnosis not present

## 2019-10-12 DIAGNOSIS — G4733 Obstructive sleep apnea (adult) (pediatric): Secondary | ICD-10-CM | POA: Diagnosis not present

## 2019-10-15 ENCOUNTER — Ambulatory Visit: Payer: 59

## 2019-10-16 ENCOUNTER — Ambulatory Visit: Payer: 59 | Attending: Internal Medicine

## 2019-10-16 DIAGNOSIS — Z23 Encounter for immunization: Secondary | ICD-10-CM

## 2019-10-16 NOTE — Progress Notes (Signed)
   Covid-19 Vaccination Clinic  Name:  MELODY CAMERON    MRN: [id number] DOB: 07/18/1975  10/16/2019  Ms. Ruiz was observed post Covid-19 immunization for 15 minutes without incident. She was provided with Vaccine Information Sheet and instruction to access the V-Safe system.   Ms. Dukart was instructed to call 911 with any severe reactions post vaccine: Marland Kitchen Difficulty breathing  . Swelling of face and throat  . A fast heartbeat  . A bad rash all over body  . Dizziness and weakness   Immunizations Administered    Name Date Dose VIS Date Route   Pfizer COVID-19 Vaccine 10/16/2019 10:36 AM 0.3 mL 06/22/2019 Intramuscular   Manufacturer: [employer]   Lot: [id number]   [place]: [id number]

## 2019-10-18 ENCOUNTER — Ambulatory Visit: Payer: 59

## 2019-11-07 DIAGNOSIS — G4733 Obstructive sleep apnea (adult) (pediatric): Secondary | ICD-10-CM | POA: Diagnosis not present

## 2019-11-09 IMAGING — MG DIGITAL DIAGNOSTIC BILATERAL MAMMOGRAM WITH TOMO AND CAD
6 of 10 series · 6 of 30 positions shown · non-contrast
Comparison: Previous exam(s).

CLINICAL DATA: Patient had benign biopsy right breast in 2760.
Patient has a left breast mass for which a follow-up was recommended
at that time. Patient currently complains of left nipple discharge.

EXAM:
DIGITAL DIAGNOSTIC BILATERAL MAMMOGRAM WITH CAD AND TOMO
ULTRASOUND LEFT BREAST

[L MLO synth-2D (1 of 2)]
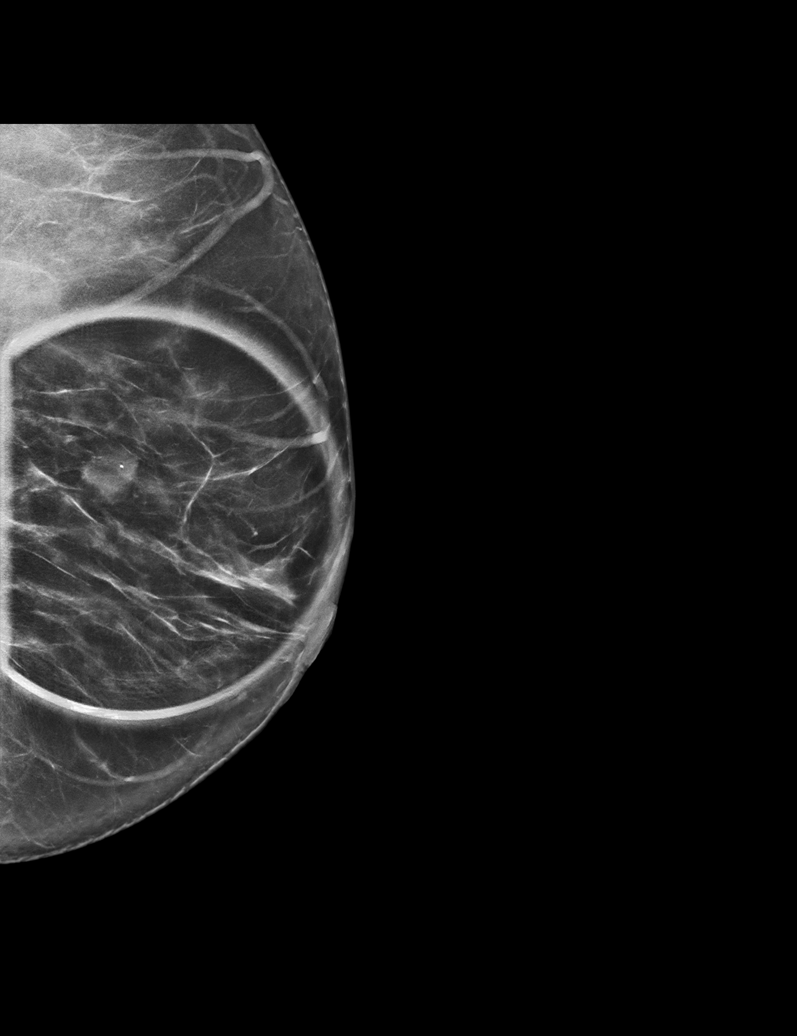

[L MLO synth-2D (2 of 2)]
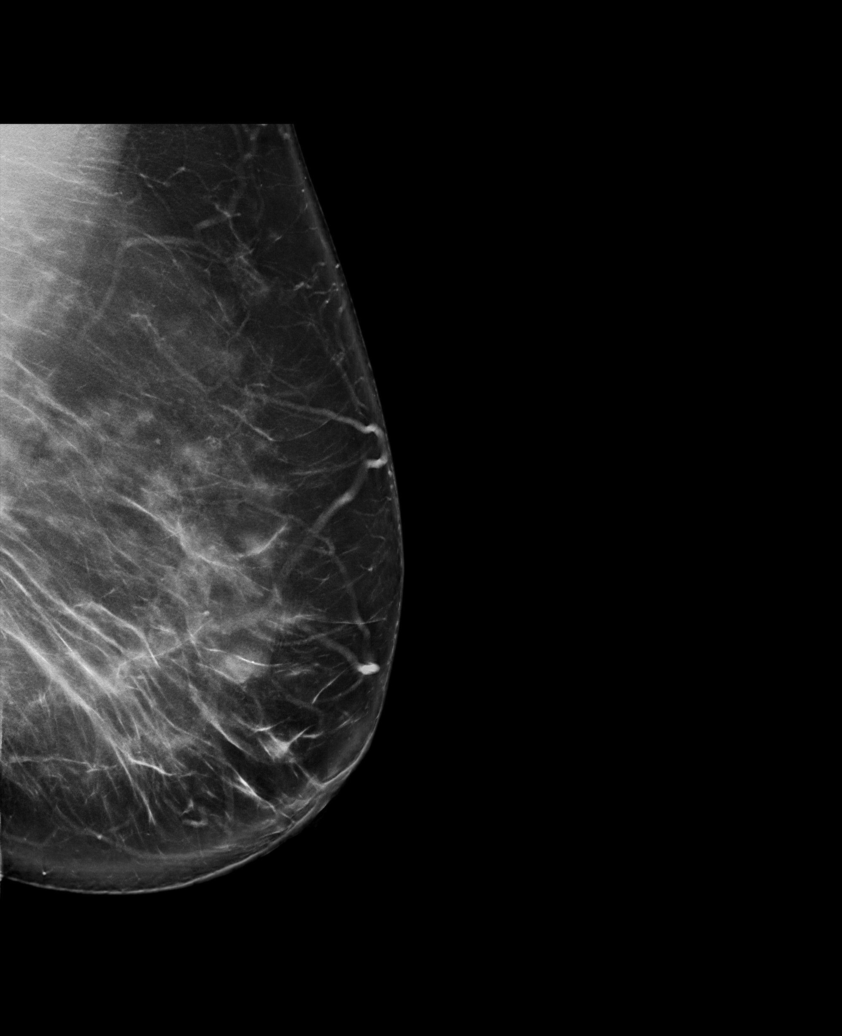

[L CC synth-2D]
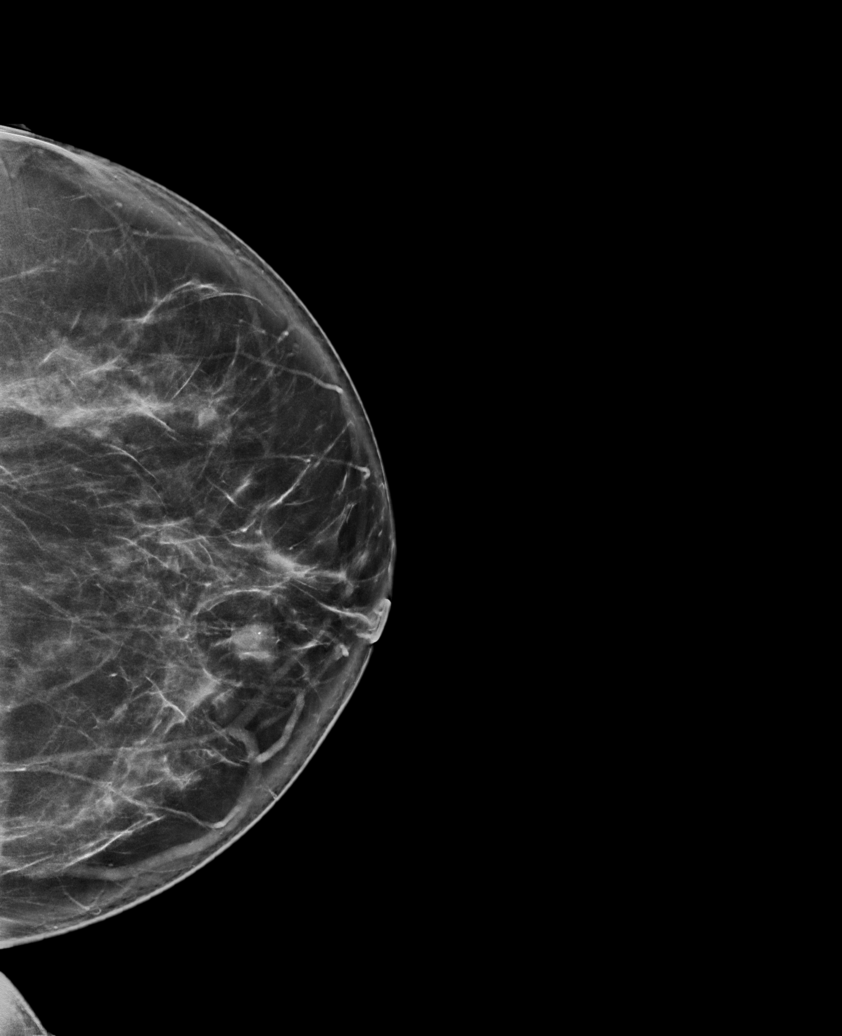

[R MLO synth-2D]
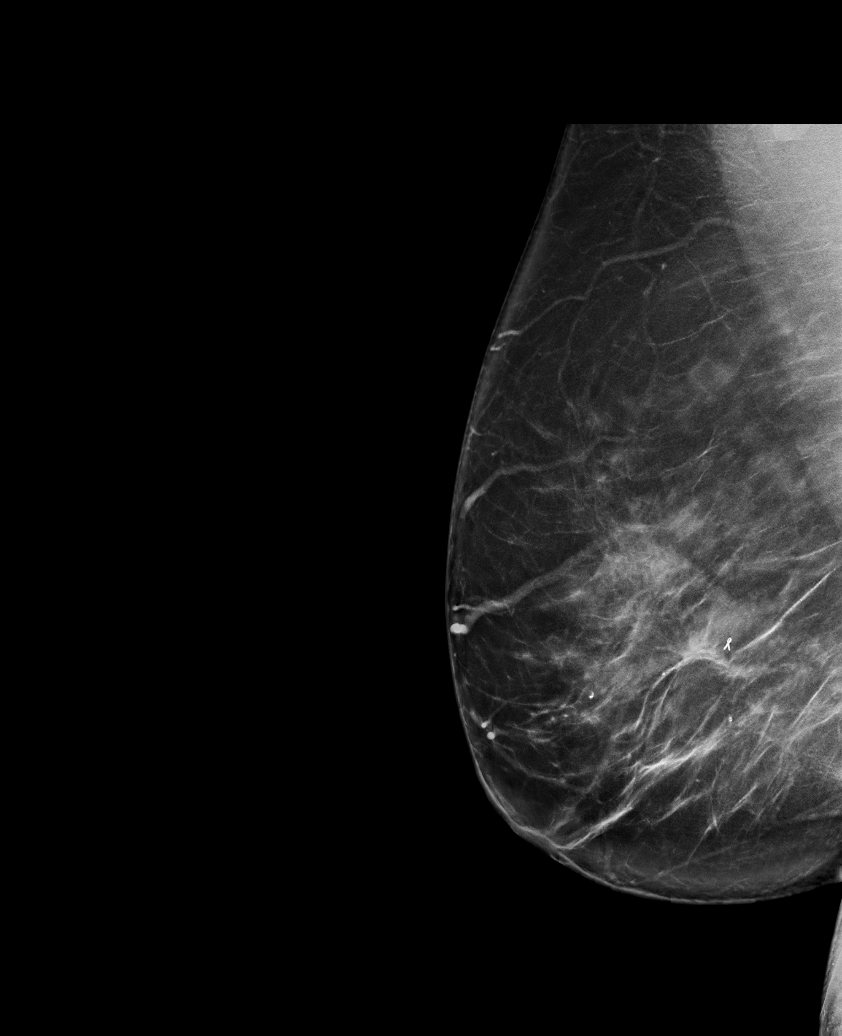

[R CC synth-2D]
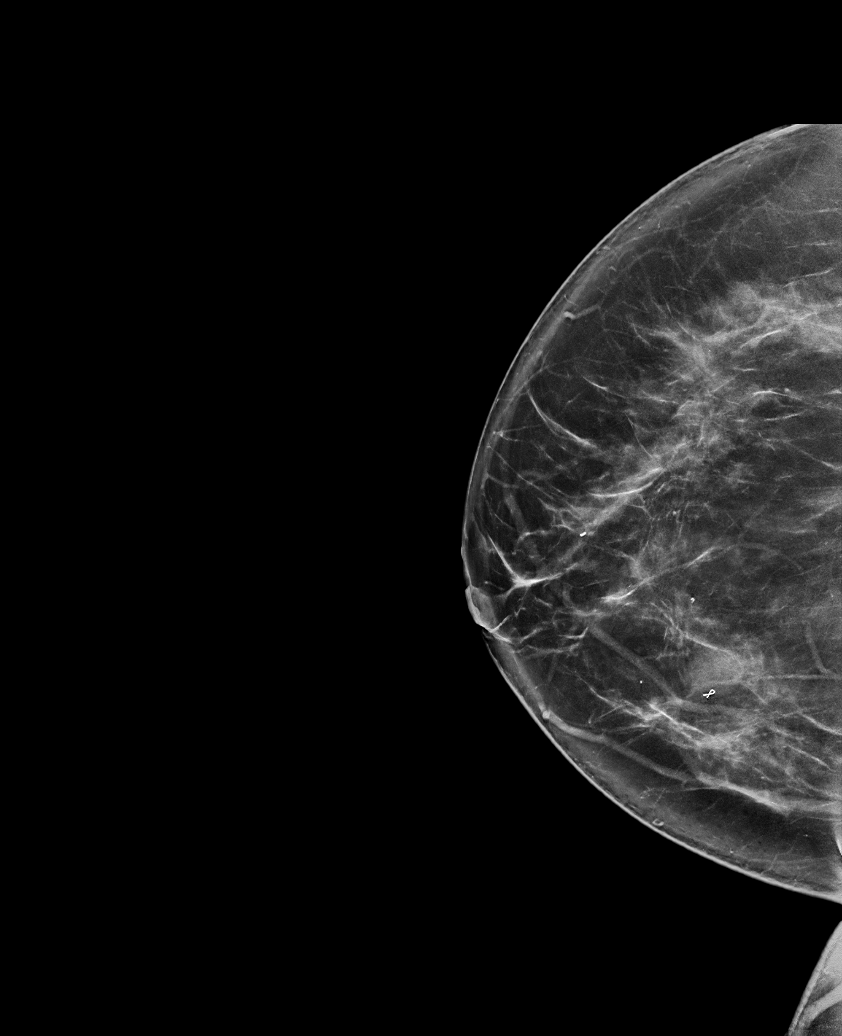

[R MLO tomo · tomo slice 49/96.0]
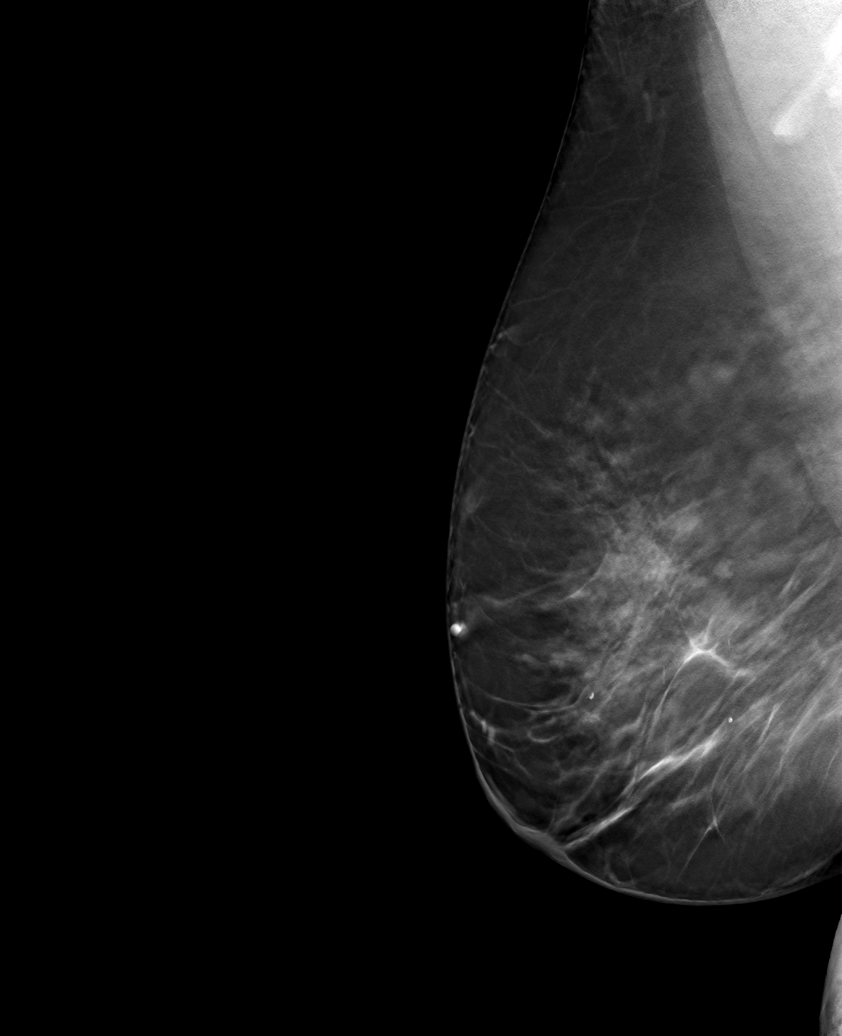

[6 of 30 positions shown; findings below may reference images not displayed]

ACR Breast Density Category b: There are scattered areas of
fibroglandular density.
FINDINGS: Cc and MLO views of bilateral breasts are submitted. Stable mass
with associated biopsy clip is identified in the right breast. There
is a mass in the retroareolar left breast unchanged.

Mammographic images were processed with CAD.

Physical exam: I expressed two duct milky discharge from the left
nipple. This is benign.

Targeted ultrasound is performed, showing macrolobulated hypoechoic
lesion at the left breast 11 o'clock 3 cm from nipple measuring
x 1.1 by 0.9 cm. This is unchanged from prior ultrasound March 25, 2016.
IMPRESSION: Benign findings.

RECOMMENDATION:
Routine screening mammogram in 1 year.

I have discussed the findings and recommendations with the patient.
Results were also provided in writing at the conclusion of the
visit. If applicable, a reminder letter will be sent to the patient
regarding the next appointment.

BI-RADS CATEGORY  2: Benign.

## 2019-11-11 DIAGNOSIS — G4733 Obstructive sleep apnea (adult) (pediatric): Secondary | ICD-10-CM | POA: Diagnosis not present

## 2019-11-27 ENCOUNTER — Ambulatory Visit: Payer: 59

## 2019-12-07 DIAGNOSIS — G4733 Obstructive sleep apnea (adult) (pediatric): Secondary | ICD-10-CM | POA: Diagnosis not present

## 2019-12-11 ENCOUNTER — Other Ambulatory Visit: Payer: Self-pay

## 2019-12-11 ENCOUNTER — Ambulatory Visit
Admission: RE | Admit: 2019-12-11 | Discharge: 2019-12-11 | Disposition: A | Discharge status: Home / Self Care | Payer: 59 | Source: Ambulatory Visit | Attending: Family Medicine | Admitting: Family Medicine

## 2019-12-11 DIAGNOSIS — Z1231 Encounter for screening mammogram for malignant neoplasm of breast: Secondary | ICD-10-CM

## 2019-12-12 DIAGNOSIS — G4733 Obstructive sleep apnea (adult) (pediatric): Secondary | ICD-10-CM | POA: Diagnosis not present

## 2020-01-07 DIAGNOSIS — G4733 Obstructive sleep apnea (adult) (pediatric): Secondary | ICD-10-CM | POA: Diagnosis not present

## 2020-01-11 DIAGNOSIS — G4733 Obstructive sleep apnea (adult) (pediatric): Secondary | ICD-10-CM | POA: Diagnosis not present

## 2020-01-16 ENCOUNTER — Other Ambulatory Visit: Payer: Self-pay | Admitting: *Deleted

## 2020-01-16 NOTE — Patient Outreach (Signed)
[facility] [facility]) Care Management  01/16/2020  Penny Williamson 06-15-1976 [id number]   Referral received from care management assistant to screen member for [employer] HTN initiative.  Call placed to member, no answer, HIPAA compliant voice message left.  Will send unsuccessful outreach letter and follow up within the next 3-4 business days.  [name], [place], MSN [place] [phone]

## 2020-01-21 ENCOUNTER — Other Ambulatory Visit: Payer: Self-pay

## 2020-01-21 ENCOUNTER — Other Ambulatory Visit: Payer: Self-pay | Admitting: *Deleted

## 2020-01-21 NOTE — Patient Outreach (Signed)
[facility] [facility]) Care Management  01/21/2020  Penny Williamson [birth year] [id number]   Outreach attempt #2, successful, identity verified.  This care manager introduced self and stated purpose of call.  [facility] care management services explained.  [employer] HTN initiative program explained.  She report she is doing well with management of her chronic health conditions, including HTN.  She is working maintaining her recommended diet and is compliant with medications and follow up with PCP.  Agrees to be included in program, will send EMMI HTN education and blood pressure monitor.  Will place referral to health coach for ongoing disease management.  [name], [place], MSN [place] [phone]

## 2020-01-24 ENCOUNTER — Other Ambulatory Visit: Payer: Self-pay | Admitting: *Deleted

## 2020-02-05 DIAGNOSIS — G4733 Obstructive sleep apnea (adult) (pediatric): Secondary | ICD-10-CM | POA: Diagnosis not present

## 2020-02-11 ENCOUNTER — Other Ambulatory Visit: Payer: Self-pay | Admitting: *Deleted

## 2020-02-11 NOTE — Patient Outreach (Signed)
[facility] [facility]) Care Management  02/11/2020  BRYNNAN RODENBAUGH September 18, 1975 [id number]  Unsuccessful outreach attempt made to patient. RN Health Coach left HIPAA compliant voicemail message along with her contact information.  Plan: RN Health Coach will call patient within the month of August  [place], [place] [phone] [email or web address]

## 2020-02-19 DIAGNOSIS — Z20828 Contact with and (suspected) exposure to other viral communicable diseases: Secondary | ICD-10-CM | POA: Diagnosis not present

## 2020-03-03 ENCOUNTER — Other Ambulatory Visit: Payer: Self-pay | Admitting: *Deleted

## 2020-03-03 NOTE — Patient Outreach (Signed)
[facility]) Care Management  03/03/2020  JOURDAN MALDONADO [birth year] [id number]  Unsuccessful outreach attempt made to patient. RN Health Coach left HIPAA compliant voicemail message along with her contact information.  Plan: RN Health Coach will call patient within the month of September.  Emelia Loron RN, BSN [place] [phone] [email or web address]

## 2020-03-04 DIAGNOSIS — R7303 Prediabetes: Secondary | ICD-10-CM | POA: Diagnosis not present

## 2020-03-04 DIAGNOSIS — E785 Hyperlipidemia, unspecified: Secondary | ICD-10-CM | POA: Diagnosis not present

## 2020-03-04 DIAGNOSIS — I10 Essential (primary) hypertension: Secondary | ICD-10-CM | POA: Diagnosis not present

## 2020-03-04 DIAGNOSIS — G4733 Obstructive sleep apnea (adult) (pediatric): Secondary | ICD-10-CM | POA: Diagnosis not present

## 2020-03-04 DIAGNOSIS — Z720 Tobacco use: Secondary | ICD-10-CM | POA: Diagnosis not present

## 2020-03-07 DIAGNOSIS — G4733 Obstructive sleep apnea (adult) (pediatric): Secondary | ICD-10-CM | POA: Diagnosis not present

## 2020-03-27 DIAGNOSIS — Z124 Encounter for screening for malignant neoplasm of cervix: Secondary | ICD-10-CM | POA: Diagnosis not present

## 2020-03-27 DIAGNOSIS — Z01419 Encounter for gynecological examination (general) (routine) without abnormal findings: Secondary | ICD-10-CM | POA: Diagnosis not present

## 2020-03-27 DIAGNOSIS — R69 Illness, unspecified: Secondary | ICD-10-CM | POA: Diagnosis not present

## 2020-04-07 DIAGNOSIS — G4733 Obstructive sleep apnea (adult) (pediatric): Secondary | ICD-10-CM | POA: Diagnosis not present

## 2020-04-09 ENCOUNTER — Encounter: Payer: Self-pay | Admitting: *Deleted

## 2020-04-09 ENCOUNTER — Other Ambulatory Visit: Payer: Self-pay | Admitting: *Deleted

## 2020-04-09 NOTE — Patient Outreach (Signed)
[facility]) Care Management  [place]  04/09/2020   Penny Williamson 09-May-1976 [id number]  Subjective: Successful telephone outreach call to patient. HIPAA identifiers obtained. Patient states she is doing well. She reports that she received the hypertension education previously but did not receive the B/P cuff. Nurse will send the patient a B/P cuff in the mail. Patient explains that she has not read the educational information. However, she does understand that a low sodium diet, weight loss, exercise, and quitting smoking will help to improve her hypertension and her overall health. Patient states she is motivated to do this and reports that she just picked up a prescription for Wellbutrin her PCP has written to assist her with smoking cessation. Nurse will also send the patient [place] smoking cessation and 1-800-quitnow resources. Patient states she has started walking with a friend at [facility] and her goal is to start to do this 3 times weekly. She is committed to decreasing the amount of fast food and unhealthy snacks she eats and explains she will begin monitoring her B/P when she receives the B/P cuff. Patient had no further questions or concerns today and she did confirm that she had this nurse's contact number to call if needed.   Encounter Medications:  Outpatient Encounter Medications as of 04/09/2020  Medication Sig  . atorvastatin (LIPITOR) 20 MG tablet Take 20 mg by mouth daily.  Marland Kitchen buPROPion (WELLBUTRIN XL) 150 MG 24 hr tablet Take 150 mg by mouth every morning.  . metFORMIN (GLUCOPHAGE) 500 MG tablet Take by mouth 2 (two) times daily with a meal.  . triamterene-hydrochlorothiazide (DYAZIDE) 37.5-25 MG per capsule Take 1 capsule by mouth daily.   No facility-administered encounter medications on file as of 04/09/2020.    Functional Status:  No flowsheet data found.  Fall/Depression Screening: Fall Risk  04/09/2020  Falls in the past year?  0  Number falls in past yr: 0  Injury with Fall? 0  Follow up Falls prevention discussed;Falls evaluation completed   PHQ 2/9 Scores 04/09/2020  PHQ - 2 Score 0    Assessment:  Goals Addressed            This Visit's Progress   . Eat Healthy       Follow Up Date 11/31/21    - set goal weight - drink 6 to 8 glasses of water each day - fill half of plate with vegetables - limit fast food meals to no more than 1 per week - manage portion size - prepare main meal at home 3 to 5 days each week - read food labels for fat, fiber, carbohydrates and portion size    Why is this important?   When you are ready to manage your nutrition or weight, having a plan and setting goals will help.  Taking small steps to change how you eat and exercise is a good place to start.    Notes: Patient states she will limit fast food and unhealthy snacks, she does not drink many sugary drinks, she will use portion control    . Lifestyle Change       Follow Up Date 11/31/21    - agree to work together to make changes - ask questions to understand - learn about high blood pressure    Why is this important?   The changes that you are asked to make may be hard to do.  This is especially true when the changes are life-long.  Knowing  why it is important to you is the first step.  Working on the change with your family or support person helps you not feel alone.  Reward yourself and family or support person when goals are met. This can be an activity you choose like bowling, hiking, biking, swimming or shooting hoops.     Notes:     Marland Kitchen Manage My Medicine       Follow Up Date 11/31/21    - call for medicine refill 2 or 3 days before it runs out - learn to read medicine labels    Why is this important?   These steps will help you keep on track with your medicines.    Notes: Patient takes 4 medications and is compliant     . Track and Manage My Blood Pressure       Follow Up Date 11/31/21    -  check blood pressure daily - write blood pressure results in a log or diary    Why is this important?   You won't feel high blood pressure, but it can still hurt your blood vessels.  High blood pressure can cause heart or kidney problems. It can also cause a stroke.  Making lifestyle changes like losing a little weight or eating less salt will help.  Checking your blood pressure at home and at different times of the day can help to control blood pressure.  If the doctor prescribes medicine remember to take it the way the doctor ordered.  Call the office if you cannot afford the medicine or if there are questions about it.     Notes: Patient states she will start to monitor her B/P when she receives the B/P cuff.      Plan: RN Health Coach will send PCP a barrier letter and today's assessment note, will send patient a B/P cuff, Healthy Meal Planning booklet, and hypertension education, will call patient within the month of November and patient agrees to future outreach calls.   Emelia Loron RN, BSN [place] [phone] [email or web address]

## 2020-04-10 ENCOUNTER — Ambulatory Visit: Payer: Self-pay | Admitting: *Deleted

## 2020-04-21 DIAGNOSIS — Z03818 Encounter for observation for suspected exposure to other biological agents ruled out: Secondary | ICD-10-CM | POA: Diagnosis not present

## 2020-05-05 DIAGNOSIS — G4733 Obstructive sleep apnea (adult) (pediatric): Secondary | ICD-10-CM | POA: Diagnosis not present

## 2020-06-05 DIAGNOSIS — G4733 Obstructive sleep apnea (adult) (pediatric): Secondary | ICD-10-CM | POA: Diagnosis not present

## 2020-06-09 ENCOUNTER — Other Ambulatory Visit: Payer: Self-pay | Admitting: *Deleted

## 2020-06-09 NOTE — Patient Outreach (Signed)
[facility]) Care Management  06/09/2020  [name] 12-14-1975 [id number]  Unsuccessful outreach attempt made to patient. RN Health Coach left HIPAA compliant voicemail message along with her contact information.  Plan: RN Health Coach will call patient within the month of December.  Emelia Loron RN, BSN [place] [phone] [email or web address]

## 2020-06-27 ENCOUNTER — Other Ambulatory Visit: Payer: Self-pay | Admitting: *Deleted

## 2020-06-27 NOTE — Patient Outreach (Signed)
[place] [facility]) Care Management  06/27/2020  LEONTINE RADMAN 1975-07-26 [id number]  Unsuccessful outreach attempt made to patient. RN Health Coach left HIPAA compliant voicemail message along with her contact information.  Plan: RN Health Coach will call patient within the month of January.  Emelia Loron RN, BSN [place] [phone] [email or web address]

## 2020-07-05 DIAGNOSIS — G4733 Obstructive sleep apnea (adult) (pediatric): Secondary | ICD-10-CM | POA: Diagnosis not present

## 2020-08-05 DIAGNOSIS — B9689 Other specified bacterial agents as the cause of diseases classified elsewhere: Secondary | ICD-10-CM | POA: Diagnosis not present

## 2020-08-05 DIAGNOSIS — Z20822 Contact with and (suspected) exposure to covid-19: Secondary | ICD-10-CM | POA: Diagnosis not present

## 2020-08-05 DIAGNOSIS — N760 Acute vaginitis: Secondary | ICD-10-CM | POA: Diagnosis not present

## 2020-08-05 DIAGNOSIS — G4733 Obstructive sleep apnea (adult) (pediatric): Secondary | ICD-10-CM | POA: Diagnosis not present

## 2020-08-07 ENCOUNTER — Other Ambulatory Visit: Payer: Self-pay | Admitting: *Deleted

## 2020-08-07 NOTE — Patient Outreach (Signed)
[facility]) Care Management  08/07/2020  Penny Williamson 08/22/1975 [id number]  Unsuccessful outreach attempt made to patient. Patient answered the phone and stated she would not be able to speak today. Nurse stated she would call back at a later date.   Plan: RN Health Coach will call patient within the month of February.  Emelia Loron RN, BSN [place] [phone] [email or web address]

## 2020-08-29 ENCOUNTER — Other Ambulatory Visit: Payer: Self-pay | Admitting: *Deleted

## 2020-08-29 NOTE — Patient Outreach (Addendum)
[place] [facility]) Care Management  08/29/2020  Penny Williamson Jan 30, 1976 [id number]  Unsuccessful outreach attempt made to patient. RN Health Coach left HIPAA compliant voicemail message along with her contact information.  Plan: RN Health Coach will call patient within the month of March.  Emelia Loron RN, BSN [place] [phone] [email or web address]

## 2020-09-05 DIAGNOSIS — G4733 Obstructive sleep apnea (adult) (pediatric): Secondary | ICD-10-CM | POA: Diagnosis not present

## 2020-09-29 ENCOUNTER — Other Ambulatory Visit: Payer: Self-pay | Admitting: *Deleted

## 2020-09-29 NOTE — Patient Outreach (Signed)
[place] [facility]) Care Management  09/29/2020  Penny Williamson 12/08/1975 [id number]  Unsuccessful outreach attempt made to patient. RN Health Coach left HIPAA compliant voicemail message along with her contact information.  Plan: RN Health Coach will call patient within the month of April and will send an unsuccessful letter.   Emelia Loron RN, BSN [place] [phone] [email or web address]

## 2020-10-01 DIAGNOSIS — G4733 Obstructive sleep apnea (adult) (pediatric): Secondary | ICD-10-CM | POA: Diagnosis not present

## 2020-10-01 DIAGNOSIS — E785 Hyperlipidemia, unspecified: Secondary | ICD-10-CM | POA: Diagnosis not present

## 2020-10-01 DIAGNOSIS — E669 Obesity, unspecified: Secondary | ICD-10-CM | POA: Diagnosis not present

## 2020-10-01 DIAGNOSIS — R7303 Prediabetes: Secondary | ICD-10-CM | POA: Diagnosis not present

## 2020-10-01 DIAGNOSIS — I10 Essential (primary) hypertension: Secondary | ICD-10-CM | POA: Diagnosis not present

## 2020-10-01 DIAGNOSIS — Z720 Tobacco use: Secondary | ICD-10-CM | POA: Diagnosis not present

## 2020-10-03 DIAGNOSIS — G4733 Obstructive sleep apnea (adult) (pediatric): Secondary | ICD-10-CM | POA: Diagnosis not present

## 2020-11-03 ENCOUNTER — Other Ambulatory Visit: Payer: Self-pay | Admitting: *Deleted

## 2020-11-03 NOTE — Patient Outreach (Signed)
[place]) Care Management  11/03/2020  Penny Williamson [birth year] [id number]  Multiple attempts (6) to establish contact with patient without success. No response from unsuccessful letter mailed to patient. Case is being closed at this time.   Plan: RN Health Coach will close case.  Emelia Loron RN, BSN [place] [phone] [email or web address]

## 2020-12-16 ENCOUNTER — Other Ambulatory Visit: Payer: Self-pay | Admitting: Family Medicine

## 2020-12-16 DIAGNOSIS — Z1231 Encounter for screening mammogram for malignant neoplasm of breast: Secondary | ICD-10-CM

## 2020-12-22 ENCOUNTER — Ambulatory Visit: Payer: 59

## 2021-02-16 ENCOUNTER — Ambulatory Visit
Admission: RE | Admit: 2021-02-16 | Discharge: 2021-02-16 | Disposition: A | Discharge status: Home / Self Care | Payer: Managed Care, Other (non HMO) | Source: Ambulatory Visit | Attending: Family Medicine | Admitting: Family Medicine

## 2021-02-16 ENCOUNTER — Other Ambulatory Visit: Payer: Self-pay

## 2021-02-16 DIAGNOSIS — Z1231 Encounter for screening mammogram for malignant neoplasm of breast: Secondary | ICD-10-CM

## 2021-02-17 ENCOUNTER — Other Ambulatory Visit: Payer: Self-pay | Admitting: Family Medicine

## 2021-02-17 DIAGNOSIS — N6452 Nipple discharge: Secondary | ICD-10-CM

## 2021-02-24 ENCOUNTER — Other Ambulatory Visit: Payer: Self-pay

## 2021-02-24 ENCOUNTER — Ambulatory Visit (INDEPENDENT_AMBULATORY_CARE_PROVIDER_SITE_OTHER): Payer: Managed Care, Other (non HMO) | Admitting: Podiatry

## 2021-02-24 ENCOUNTER — Encounter: Payer: Self-pay | Admitting: Podiatry

## 2021-02-24 DIAGNOSIS — L600 Ingrowing nail: Secondary | ICD-10-CM | POA: Diagnosis not present

## 2021-02-24 MED ORDER — NEOMYCIN-POLYMYXIN-HC 1 % OT SOLN: OTIC | 1 refills | Status: DC

## 2021-02-24 NOTE — Patient Instructions (Signed)

## 2021-02-25 NOTE — Progress Notes (Signed)
She presents today chief complaint of a painful nail hallux right fibular border.  Objective: Pulses remain palpable nail is sharp incurvated along with fibular border the right hallux.  No purulence no malodor no open lesions or wounds.  Assessment: Ingrown nail fibular border hallux right.  Plan: Chemical matricectomy to the fibular border was performed today tolerated procedure well after local anesthetic was administered.  She was provided both oral and written home-going instruction for the care and soaking of the toe as well as a prescription for Corticosporin otic I will follow-up with her in a couple weeks to make sure she is doing well.

## 2021-03-17 ENCOUNTER — Ambulatory Visit: Payer: Managed Care, Other (non HMO) | Admitting: Podiatry

## 2021-04-02 ENCOUNTER — Other Ambulatory Visit: Payer: Managed Care, Other (non HMO)

## 2021-05-01 ENCOUNTER — Ambulatory Visit
Admission: RE | Admit: 2021-05-01 | Discharge: 2021-05-01 | Disposition: A | Discharge status: Home / Self Care | Payer: Managed Care, Other (non HMO) | Source: Ambulatory Visit | Attending: Family Medicine | Admitting: Family Medicine

## 2021-05-01 ENCOUNTER — Other Ambulatory Visit: Payer: Self-pay

## 2021-05-01 ENCOUNTER — Ambulatory Visit: Payer: Managed Care, Other (non HMO)

## 2021-05-01 DIAGNOSIS — N6452 Nipple discharge: Secondary | ICD-10-CM

## 2022-05-31 ENCOUNTER — Emergency Department (HOSPITAL_COMMUNITY): Payer: BC Managed Care – PPO

## 2022-05-31 ENCOUNTER — Emergency Department (HOSPITAL_COMMUNITY)
Admission: EM | Admit: 2022-05-31 | Discharge: 2022-06-01 | Disposition: A | Discharge status: Home / Self Care | Payer: BC Managed Care – PPO | Attending: Emergency Medicine | Admitting: Emergency Medicine

## 2022-05-31 DIAGNOSIS — R0789 Other chest pain: Secondary | ICD-10-CM | POA: Diagnosis not present

## 2022-05-31 DIAGNOSIS — R079 Chest pain, unspecified: Secondary | ICD-10-CM | POA: Diagnosis present

## 2022-05-31 LAB — BASIC METABOLIC PANEL
Anion gap: 14 (ref 5–15)
BUN: 15 mg/dL (ref 6–20)
CO2: 24 mmol/L (ref 22–32)
Calcium: 9.8 mg/dL (ref 8.9–10.3)
Chloride: 100 mmol/L (ref 98–111)
Creatinine, Ser: 0.85 mg/dL (ref 0.44–1.00)
GFR, Estimated: >60 mL/min (ref >60)
Glucose, Bld: 144 mg/dL — ABNORMAL HIGH (ref 70–99)
Potassium: 3.8 mmol/L (ref 3.5–5.1)
Sodium: 138 mmol/L (ref 135–145)

## 2022-05-31 LAB — TROPONIN I (HIGH SENSITIVITY)
Troponin I (High Sensitivity): 2 ng/L (ref <18)
Troponin I (High Sensitivity): 3 ng/L (ref <18)

## 2022-05-31 LAB — CBC
HCT: 38.9 % (ref 36.0–46.0)
Hemoglobin: 13.4 g/dL (ref 12.0–15.0)
MCH: 29.6 pg (ref 26.0–34.0)
MCHC: 34.4 g/dL (ref 30.0–36.0)
MCV: 85.9 fL (ref 80.0–100.0)
Platelets: 165 10*3/uL (ref 150–400)
RBC: 4.53 MIL/uL (ref 3.87–5.11)
RDW: 12.5 % (ref 11.5–15.5)
WBC: 6.6 10*3/uL (ref 4.0–10.5)
nRBC: 0 % (ref 0.0–0.2)

## 2022-05-31 LAB — I-STAT BETA HCG BLOOD, ED (MC, WL, AP ONLY): I-stat hCG, quantitative: <5 m[IU]/mL (ref <5)

## 2022-05-31 NOTE — ED Triage Notes (Signed)
Patient here with complaint if right sided sharp chest pain that started yesterday morning. Patient denies nausea, denies vomiting, denies shortness of breath. Patient is alert, oriented, speaking in complete sentences and is in no apparent distress at this time.

## 2022-05-31 NOTE — ED Provider Notes (Signed)
[facility] Emergency Department Provider Note MRN:  [id number]  Arrival date & time: 06/01/22     Chief Complaint   Chest Pain   History of Present Illness   Penny Williamson is a 46 y.o. year-old female presents to the ED with chief complaint of right-sided chest pain that started yesterday morning.  She states that its been persistent throughout the day.  It is worsened with palpation and with movement.  Patient states that she has occasionally had some twinges of pain into the left, but contrary to triage note complains primarily of right-sided chest pain.  She states that she felt a little short of breath at dinner tonight.  She does not feel short of breath now.  She states that the pain moves around her ribs on the right.  She denies abdominal pain.  Denies nausea, vomiting, diarrhea..  History provided by patient.   Review of Systems  Pertinent positive and negative review of systems noted in HPI.    Physical Exam   Vitals:   05/31/22 2231 06/01/22 0000  BP:  137/84  Pulse:  75  Resp:  20  Temp: 98.1 F (36.7 C)   SpO2:  99%    CONSTITUTIONAL:  nontoxic-appearing, NAD NEURO:  Alert and oriented x 3, CN 3-12 grossly intact EYES:  eyes equal and reactive ENT/NECK:  Supple, no stridor  CARDIO:  normal rate, regular rhythm, appears well-perfused, right anterior chest wall quit tender to palpation, pain also worsened with chest flexion PULM:  No respiratory distress, CTAB GI/GU:  non-distended,  MSK/SPINE:  No gross deformities, no edema, moves all extremities  SKIN:  no rash, atraumatic   *Additional and/or pertinent findings included in MDM below  Diagnostic and Interventional Summary    EKG Interpretation  Date/Time:  Monday May 31 2022 16:17:31 EST Ventricular Rate:  94 PR Interval:  154 QRS Duration: 88 QT Interval:  350 QTC Calculation: 437 R Axis:   45 Text Interpretation: Normal sinus rhythm Normal ECG No previous ECGs  available Confirmed by Quintella Reichert [id number]) on 05/31/2022 10:54:08 PM       Labs Reviewed  BASIC METABOLIC PANEL - Abnormal; Notable for the following components:      Result Value   Glucose, Bld 144 (*)    All other components within normal limits  CBC  D-DIMER, QUANTITATIVE  I-STAT BETA HCG BLOOD, ED ([facility], [facility], [facility] ONLY)  TROPONIN I (HIGH SENSITIVITY)  TROPONIN I (HIGH SENSITIVITY)    DG Chest 2 View  Final Result      Medications - No data to display   Procedures  /  Critical Care Procedures  ED Course and Medical Decision Making  I have reviewed the triage vital signs, the nursing notes, and pertinent available records from the EMR.  Social Determinants Affecting Complexity of Care: Patient has no clinically significant social determinants affecting this chief complaint..   ED Course:    Medical Decision Making Patient here with right-sided chest pain that started yesterday morning.  Symptoms have been persistent.  It is easily reproducible with palpation and movement.  I have a low suspicion for ACS.  She is not tachycardic nor hypoxic.  We will add on D-dimer, but PE is thought less likely.  I doubt dissection.  Chest x-ray does not show any evidence of infection or pneumothorax.  No evidence of rib fracture.  No rash on skin to suggest shingles.  Based on the reproducibility of the symptoms, I suspect this  is likely chest wall or muscular inflammation.  Amount and/or Complexity of Data Reviewed Labs: ordered.    Details: Trop 2->3 doubt ACS, no significant electrolyte derangement, no leukocytosis to suggest infection. D-dimer negative, doubt PE. Radiology: ordered and independent interpretation performed.    Details: CXR negative for infection ECG/medicine tests: independent interpretation performed.    Details: NSR, no ischemic changes  Risk Prescription drug management. Decision regarding hospitalization.     Consultants: No consultations were needed in  caring for this patient.   Treatment and Plan: I considered admission due to patient's initial presentation, but after considering the examination and diagnostic results, patient will not require admission and can be discharged with outpatient follow-up.    Final Clinical Impressions(s) / ED Diagnoses     ICD-10-CM   1. Chest wall pain  R07.89       ED Discharge Orders          Ordered    ibuprofen (ADVIL) 800 MG tablet  3 times daily        06/01/22 0050    cyclobenzaprine (FLEXERIL) 10 MG tablet  3 times daily PRN        06/01/22 0050              Discharge Instructions Discussed with and Provided to Patient:   Discharge Instructions   None      Montine Circle, PA-C 06/01/22 0056    Quintella Reichert, MD 06/01/22 [phone]

## 2022-05-31 NOTE — ED Provider Triage Note (Signed)
Emergency Medicine Provider Triage Evaluation Note  Penny Williamson , a 46 y.o. female  was evaluated in triage.  Pt complains of left-sided chest pain.  Patient notes onset yesterday morning.  Reports constant nature since onset with intermittent sharp episodes.  Radiation to right shoulder.  Pain is worsened with consumption of food.  No associated shortness of breath, fever, chills, night sweats, abdominal pain, nausea, vomiting.  Review of Systems  Positive: See above Negative:   Physical Exam  BP (!) 163/88 (BP Location: Left Arm)   Pulse 96   Temp 98.8 F (37.1 C)   Resp 18   SpO2 96%  Gen:   Awake, no distress   Resp:  Normal effort  MSK:   Moves extremities without difficulty  Other:  No obvious murmurs gallops rubs.  Lungs clear to auscultation bilaterally.  No lower extremity edema noted.  Medical Decision Making  Medically screening exam initiated at 4:28 PM.  Appropriate orders placed.  Penny Williamson was informed that the remainder of the evaluation will be completed by another provider, this initial triage assessment does not replace that evaluation, and the importance of remaining in the ED until their evaluation is complete.     Wilnette Kales, [place] 05/31/22 [phone]

## 2022-06-01 LAB — D-DIMER, QUANTITATIVE: D-Dimer, Quant: 0.29 ug/mL-FEU (ref 0.00–0.50)

## 2022-06-01 MED ORDER — IBUPROFEN 800 MG PO TABS: 800.0000 mg | ORAL_TABLET | Freq: Three times a day (TID) | ORAL | 0 refills | Status: DC

## 2022-06-01 MED ORDER — CYCLOBENZAPRINE HCL 10 MG PO TABS: 10.0000 mg | ORAL_TABLET | Freq: Three times a day (TID) | ORAL | 0 refills | Status: DC | PRN

## 2023-03-17 NOTE — Progress Notes (Unsigned)
[facility] [phone]   [phone]  INITIAL CONSULT NOTE  Patient Care Team: Jarrett Soho, Cordelia Poche as PCP - General (Family Medicine)  Hematological/Oncological History Outside labs: 10/2022: Plt 130K 01/2023: Plt 126K 03/18/2023: Establish care with [facility]  CHIEF COMPLAINTS/PURPOSE OF CONSULTATION:  Thrombocytopenia  HISTORY OF PRESENTING ILLNESS:  Modesta Messing 47 y.o. female with medical history significant for hypercholesteremia, hypertension, allergies and sickle cell trait presents to the hematology clinic for evaluation of thrombocytopenia. She is  unaccompanied for this visit.   On exam today, Ms. Hildenbrandt reports that she was told about her low platelets in the past when she was pregnant in 2011. No intervention was done per patient's recollection. She does bruise easily but denies any overt signs of bleeding. She is otherwise feeling well without fevers, chills, sweats, shortness of breath, chest pain, cough, nausea, vomiting or bowel habit changes. Rest of the ROS is below.    MEDICAL HISTORY:  Past Medical History:  Diagnosis Date   Allergy    Hypercholesteremia    Hypertension    Sickle cell trait (HCC)     SURGICAL HISTORY: Past Surgical History:  Procedure Laterality Date   WISDOM TOOTH EXTRACTION      SOCIAL HISTORY: Social History   Socioeconomic History   Marital status: Single    Spouse name: Not on file   Number of children: 2   Years of education: Not on file   Highest education level: Associate degree: occupational, Scientist, product/process development, or vocational program  Occupational History   Occupation: [employer]  Tobacco Use   Smoking status: Every Day    Current packs/day: 0.50    Types: Cigarettes   Smokeless tobacco: Never   Tobacco comments:    Was prescribed Wellbuttrin very recently to help with quitting  Vaping Use   Vaping status: Never Used  Substance and Sexual Activity   Alcohol use:  Yes    Alcohol/week: 3.0 standard drinks of alcohol    Types: 3 Standard drinks or equivalent per week   Drug use: No   Sexual activity: Yes    Birth control/protection: None  Other Topics Concern   Not on file  Social History Narrative   Not on file   Social Determinants of Health   Financial Resource Strain: Low Risk  (04/09/2020)   Overall Financial Resource Strain (CARDIA)    Difficulty of Paying Living Expenses: Not very hard  Food Insecurity: Food Insecurity Present (03/18/2023)   Hunger Vital Sign    Worried About Running Out of Food in the Last Year: Sometimes true    Ran Out of Food in the Last Year: Sometimes true  Transportation Needs: No Transportation Needs (03/18/2023)   PRAPARE - Administrator, Civil Service (Medical): No    Lack of Transportation (Non-Medical): No  Physical Activity: Not on file  Stress: Not on file  Social Connections: Unknown (09/14/2022)   [facility] from [facility]   Social Network    Social Network: Not on file  Intimate Partner Violence: Not At Risk (03/18/2023)   Humiliation, Afraid, Rape, and Kick questionnaire    Fear of Current or Ex-Partner: No    Emotionally Abused: No    Physically Abused: No    Sexually Abused: No    FAMILY HISTORY: Family History  Problem Relation Age of Onset   Lupus Mother    Fibromyalgia Mother    Heart disease Maternal Grandmother    Diabetes Maternal Grandmother  ALLERGIES:  is allergic to penicillins.  MEDICATIONS:  Current Outpatient Medications  Medication Sig Dispense Refill   atorvastatin (LIPITOR) 20 MG tablet Take 20 mg by mouth daily.     metFORMIN (GLUCOPHAGE) 850 MG tablet Take by mouth daily with breakfast.     OZEMPIC, 0.25 OR 0.5 MG/DOSE, 2 MG/3ML SOPN Inject into the skin.     triamterene-hydrochlorothiazide (DYAZIDE) 37.5-25 MG per capsule Take 1 capsule by mouth daily.  3   cyclobenzaprine (FLEXERIL) 10 MG tablet Take 1 tablet (10 mg total) by mouth 3  (three) times daily as needed for muscle spasms. (Patient not taking: Reported on 03/18/2023) 10 tablet 0   ibuprofen (ADVIL) 800 MG tablet Take 1 tablet (800 mg total) by mouth 3 (three) times daily. (Patient not taking: Reported on 03/18/2023) 21 tablet 0   NEOMYCIN-POLYMYXIN-HYDROCORTISONE (CORTISPORIN) 1 % SOLN OTIC solution Apply 1-2 drops to toe BID after soaking (Patient not taking: Reported on 03/18/2023) 10 mL 1   No current facility-administered medications for this visit.    REVIEW OF SYSTEMS:   Constitutional: ( - ) fevers, ( - )  chills , ( - ) night sweats Eyes: ( - ) blurriness of vision, ( - ) double vision, ( - ) watery eyes Ears, nose, mouth, throat, and face: ( - ) mucositis, ( - ) sore throat Respiratory: ( - ) cough, ( - ) dyspnea, ( - ) wheezes Cardiovascular: ( - ) palpitation, ( - ) chest discomfort, ( - ) lower extremity swelling Gastrointestinal:  ( - ) nausea, ( - ) heartburn, ( - ) change in bowel habits Skin: ( - ) abnormal skin rashes Lymphatics: ( - ) new lymphadenopathy, ( - ) easy bruising Neurological: ( - ) numbness, ( - ) tingling, ( - ) new weaknesses Behavioral/Psych: ( - ) mood change, ( - ) new changes  All other systems were reviewed with the patient and are negative.  PHYSICAL EXAMINATION: ECOG PERFORMANCE STATUS: 0 - Asymptomatic  Vitals:   03/18/23 0915  BP: 130/82  Pulse: 85  Resp: 20  Temp: (!) 97.3 F (36.3 C)  SpO2: 100%   Filed Weights   03/18/23 0915  Weight: 182 lb 1.6 oz (82.6 kg)    GENERAL: well appearing female in NAD  SKIN: skin color, texture, turgor are normal, no rashes or significant lesions EYES: conjunctiva are pink and non-injected, sclera clear OROPHARYNX: no exudate, no erythema; lips, buccal mucosa, and tongue normal  LUNGS: clear to auscultation and percussion with normal breathing effort HEART: regular rate & rhythm and no murmurs and no lower extremity edema ABDOMEN: soft, non-tender, non-distended, normal bowel  sounds Musculoskeletal: no cyanosis of digits and no clubbing  PSYCH: alert & oriented x 3, fluent speech NEURO: no focal motor/sensory deficits  LABORATORY DATA:  I have reviewed the data as listed    Latest Ref Rng & Units 05/31/2022    4:30 PM 12/29/2009    3:49 AM 12/28/2009    5:06 AM  CBC  WBC 4.0 - 10.5 K/uL 6.6  12.2  15.8   Hemoglobin 12.0 - 15.0 g/dL 56.2  13.0  86.5   Hematocrit 36.0 - 46.0 % 38.9  35.3  31.0   Platelets 150 - 400 K/uL 165  112 DELTA CHECK NOTED REPEATED TO VERIFY  73 DELTA CHECK NOTED REPEATED TO VERIFY SPECIMEN CHECKED FOR CLOTS PLATELET COUNT CONFIRMED BY SMEAR CONSISTENT WITH PREVIOUS RESULT        Latest Ref Rng &  Units 05/31/2022    4:30 PM 12/26/2009    4:45 PM 12/05/2009    3:23 PM  CMP  Glucose 70 - 99 mg/dL 829  562  130   BUN 6 - 20 mg/dL 15  6  3    Creatinine 0.44 - 1.00 mg/dL 8.65  7.84  6.96   Sodium 135 - 145 mmol/L 138  136  135   Potassium 3.5 - 5.1 mmol/L 3.8  3.8  3.7   Chloride 98 - 111 mmol/L 100  107  107   CO2 22 - 32 mmol/L 24  23  22    Calcium 8.9 - 10.3 mg/dL 9.8  9.1  9.2   Total Protein 6.0 - 8.3 g/dL  6.2  5.9   Total Bilirubin 0.3 - 1.2 mg/dL  0.5  0.5   Alkaline Phos 39 - 117 U/L  238  203   AST 0 - 37 U/L  16  20   ALT 0 - 35 U/L  15  14     ASSESSMENT & PLAN TASHVI FRANCIES is a 47 y.o. female who presents to the clinic for evaluation of thrombocytopenia.   I reviewed potential etiologies for thrombocytopenia including liver disease, splenomegaly, infectious processes, nutritional anemias, immune mediated and bone marrow disorders. Patient is not taking any medications that can cause thrombocytopenia. Patient will proceed with labs today to further evaluate underlying etiologies.    #Thrombocytopenia, etiology unknown: --Labs today to check CBC w/diff, CMP, B12 level, folate level, Hep B and C serologies, HIV serology. --Rule out pseudothrombocytopenia with platelet by citrate --Evaluate for platelet  abnormality with save smear. --We will obtain abdominal US if above workup is negative to evaluate for liver disease and/or splenomegaly --RTC based on above workup.    No orders of the defined types were placed in this encounter.   All questions were answered. The patient knows to call the clinic with any problems, questions or concerns.  I have spent a total of 60 minutes minutes of face-to-face and non-face-to-face time, preparing to see the patient, obtaining and/or reviewing separately obtained history, performing a medically appropriate examination, counseling and educating the patient, ordering tests/procedures, documenting clinical information in the electronic health record, independently interpreting results and communicating results to the patient, and care coordination.   Georga Kaufmann, PA-C Department of Hematology/Oncology [facility] at [facility] Phone: [phone]  Patient was seen with Dr. Leonides Schanz.   I have read the above note and personally examined the patient. I agree with the assessment and plan as noted above.  Briefly Mrs. Mua is a 47 year old female who presents for evaluation of thrombocytopenia.  On last check at [facility] her platelets were 165 on 05/31/2022.  At her primary care provider's office her platelets have been lower, with the last measurement being 126 in July 2024.  At this time would recommend a full workup for thrombocytopenia including nutritional evaluation, liver disease evaluation, and viral workup.  We will also order immature platelet fraction and platelet in citrate.  Given the mild nature of this patient's thrombocytopenia I would not recommend performing a bone marrow biopsy unless her platelets were consistently below 100.  The patient voiced understanding of our findings and the plan moving forward.   Ulysees Barns, MD Department of Hematology/Oncology [facility] at [facility] Phone:  [phone] Pager: [phone] Email: [email or web address]

## 2023-03-18 ENCOUNTER — Inpatient Hospital Stay: Payer: Medicaid Other

## 2023-03-18 ENCOUNTER — Inpatient Hospital Stay: Payer: Medicaid Other | Attending: Physician Assistant | Admitting: Physician Assistant

## 2023-03-18 ENCOUNTER — Encounter: Payer: Self-pay | Admitting: Physician Assistant

## 2023-03-18 VITALS — BP 130/82 | HR 85 | Temp 97.3°F | Resp 20 | Wt 182.1 lb

## 2023-03-18 DIAGNOSIS — D573 Sickle-cell trait: Secondary | ICD-10-CM | POA: Insufficient documentation

## 2023-03-18 DIAGNOSIS — I10 Essential (primary) hypertension: Secondary | ICD-10-CM | POA: Diagnosis not present

## 2023-03-18 DIAGNOSIS — D696 Thrombocytopenia, unspecified: Secondary | ICD-10-CM | POA: Diagnosis not present

## 2023-03-18 DIAGNOSIS — F17210 Nicotine dependence, cigarettes, uncomplicated: Secondary | ICD-10-CM | POA: Insufficient documentation

## 2023-03-18 LAB — CBC WITH DIFFERENTIAL (CANCER CENTER ONLY)
Abs Immature Granulocytes: 0.01 10*3/uL (ref 0.00–0.07)
Basophils Absolute: 0 10*3/uL (ref 0.0–0.1)
Basophils Relative: 0 %
Eosinophils Absolute: 0.1 10*3/uL (ref 0.0–0.5)
Eosinophils Relative: 1 %
HCT: 40.8 % (ref 36.0–46.0)
Hemoglobin: 14.1 g/dL (ref 12.0–15.0)
Immature Granulocytes: 0 %
Lymphocytes Relative: 34 %
Lymphs Abs: 2 10*3/uL (ref 0.7–4.0)
MCH: 30.3 pg (ref 26.0–34.0)
MCHC: 34.6 g/dL (ref 30.0–36.0)
MCV: 87.7 fL (ref 80.0–100.0)
Monocytes Absolute: 0.6 10*3/uL (ref 0.1–1.0)
Monocytes Relative: 9 %
Neutro Abs: 3.2 10*3/uL (ref 1.7–7.7)
Neutrophils Relative %: 56 %
Platelet Count: 141 10*3/uL — ABNORMAL LOW (ref 150–400)
RBC: 4.65 MIL/uL (ref 3.87–5.11)
RDW: 13.6 % (ref 11.5–15.5)
WBC Count: 5.9 10*3/uL (ref 4.0–10.5)
nRBC: 0 % (ref 0.0–0.2)

## 2023-03-18 LAB — CMP (CANCER CENTER ONLY)
ALT: 29 U/L (ref 0–44)
AST: 20 U/L (ref 15–41)
Albumin: 4.6 g/dL (ref 3.5–5.0)
Alkaline Phosphatase: 95 U/L (ref 38–126)
Anion gap: 6 (ref 5–15)
BUN: 15 mg/dL (ref 6–20)
CO2: 32 mmol/L (ref 22–32)
Calcium: 9.9 mg/dL (ref 8.9–10.3)
Chloride: 103 mmol/L (ref 98–111)
Creatinine: 0.84 mg/dL (ref 0.44–1.00)
GFR, Estimated: >60 mL/min (ref >60)
Glucose, Bld: 103 mg/dL — ABNORMAL HIGH (ref 70–99)
Potassium: 3.5 mmol/L (ref 3.5–5.1)
Sodium: 141 mmol/L (ref 135–145)
Total Bilirubin: 0.9 mg/dL (ref 0.3–1.2)
Total Protein: 7.7 g/dL (ref 6.5–8.1)

## 2023-03-18 LAB — WBC/PLT IN CITRATE

## 2023-03-18 LAB — HIV ANTIBODY (ROUTINE TESTING W REFLEX): HIV Screen 4th Generation wRfx: NONREACTIVE

## 2023-03-18 LAB — FOLATE: Folate: 15.5 ng/mL (ref >5.9)

## 2023-03-18 LAB — VITAMIN B12: Vitamin B-12: 371 pg/mL (ref 180–914)

## 2023-03-18 LAB — SAVE SMEAR(SSMR), FOR PROVIDER SLIDE REVIEW

## 2023-03-18 LAB — HEPATITIS C ANTIBODY: HCV Ab: NONREACTIVE

## 2023-03-18 LAB — HEPATITIS B SURFACE ANTIBODY,QUALITATIVE: Hep B S Ab: NONREACTIVE

## 2023-03-18 LAB — HEPATITIS B CORE ANTIBODY, TOTAL: Hep B Core Total Ab: NONREACTIVE

## 2023-03-18 LAB — IMMATURE PLATELET FRACTION: Immature Platelet Fraction: 22.7 % — ABNORMAL HIGH (ref 1.2–8.6)

## 2023-03-18 LAB — HEPATITIS B SURFACE ANTIGEN: Hepatitis B Surface Ag: NONREACTIVE

## 2023-03-21 LAB — METHYLMALONIC ACID, SERUM: Methylmalonic Acid, Quantitative: 90 nmol/L (ref 0–378)

## 2023-03-23 ENCOUNTER — Telehealth: Payer: Self-pay | Admitting: Physician Assistant

## 2023-03-23 NOTE — Telephone Encounter (Signed)
I spoke to [name] to review her lab results from 03/18/2023. Her plt count was slightly below normal measuring 141K. Rest of the workup was negative except for elevated immature platelet fraction which suggest ITP as underlying etiology. Since patient is asymptomatic and thrombocytopenia is mild, we recommend monitoring at this time. I offered for patient's PCP to monitor her blood counts every 6-12 months. Patient prefers for our clinic to monitor her blood counts.   Patient will return to our clinic in 6 months with repeat labs and visit. Penny Williamson expressed understanding and satisfaction with the plan provided.

## 2023-03-23 NOTE — Telephone Encounter (Signed)
Patient is aware of scheduled appointment times/dates

## 2023-05-05 ENCOUNTER — Telehealth: Payer: Self-pay | Admitting: Internal Medicine

## 2023-05-05 NOTE — Telephone Encounter (Signed)
Reviewed records from [facility]. I cannot see when her next colonoscopy was recommended. Can you check with the patient to see when a follow up colonoscopy was recommended?  Colonoscopy 08/12/21: Good prep. One 15 mm polyp in the sigmoid colon, removed with hot snare. One 5 mm polyp in the rectum that was removed piecemeal with cold biopsy forceps.  Path: Tubular adenoma with high grade dysplasia in the sigmoid. Hyperplastic polyp with features of mucosal prolapse.

## 2023-05-05 NOTE — Telephone Encounter (Signed)
Good afternoon Dr. Leonides Schanz,   Supervising Provider 10/24 PM  We received a call from this patient wishing to schedule an appointment for a colonoscopy.  Patient last had colonoscopy in 08/2020 with Dr. Marca Ancona at [facility]. She is wanting to transfer due to being referred to our practice. Records from previous procedure were obtained and scanned into Media for you to review. Would you please advise on scheduling?  Thank you.

## 2023-06-08 ENCOUNTER — Encounter: Payer: Self-pay | Admitting: Internal Medicine

## 2023-06-08 NOTE — Telephone Encounter (Signed)
Patient stated that she was due to have procedure in 2/24. Patient has been scheduled for procedure for January.

## 2023-07-27 ENCOUNTER — Ambulatory Visit (AMBULATORY_SURGERY_CENTER): Payer: Medicaid Other

## 2023-07-27 VITALS — Ht 66.0 in | Wt 173.0 lb

## 2023-07-27 DIAGNOSIS — Z860100 Personal history of colon polyps, unspecified: Secondary | ICD-10-CM

## 2023-07-27 MED ORDER — PEG 3350-KCL-NA BICARB-NACL 420 G PO SOLR: 4000.0000 mL | Freq: Once | ORAL | 0 refills | Status: AC

## 2023-07-27 NOTE — Progress Notes (Signed)
 No egg or soy allergy known to patient  No issues known to pt with past sedation with any surgeries or procedures Patient denies ever being told they had issues or difficulty with intubation  No FH of Malignant Hyperthermia Pt is on diet pills (Ozempic) Pt is not on  home 02  Pt is not on blood thinners  Pt denies issues with constipation  No A fib or A flutter Have any cardiac testing pending--No Pt can ambulate  Pt denies use of chewing tobacco Discussed diabetic I weight loss medication holds Discussed NSAID holds Checked BMI Pt instructed to use Singlecare.com or GoodRx for a price reduction on prep  Patient's chart reviewed by Rogena Class CNRA prior to previsit and patient appropriate for the [facility].  Pre visit completed and red dot placed by patient's name on their procedure day (on provider's schedule).

## 2023-08-07 ENCOUNTER — Encounter: Payer: Self-pay | Admitting: Certified Registered Nurse Anesthetist

## 2023-08-10 ENCOUNTER — Encounter: Payer: Self-pay | Admitting: Internal Medicine

## 2023-08-10 ENCOUNTER — Ambulatory Visit: Payer: Medicaid Other | Admitting: Internal Medicine

## 2023-08-10 VITALS — BP 134/93 | HR 77 | Temp 97.3°F | Resp 16 | Ht 66.0 in | Wt 173.0 lb

## 2023-08-10 DIAGNOSIS — D125 Benign neoplasm of sigmoid colon: Secondary | ICD-10-CM

## 2023-08-10 DIAGNOSIS — Z860100 Personal history of colon polyps, unspecified: Secondary | ICD-10-CM

## 2023-08-10 DIAGNOSIS — K529 Noninfective gastroenteritis and colitis, unspecified: Secondary | ICD-10-CM

## 2023-08-10 DIAGNOSIS — Z860101 Personal history of adenomatous and serrated colon polyps: Secondary | ICD-10-CM | POA: Diagnosis not present

## 2023-08-10 DIAGNOSIS — Z1211 Encounter for screening for malignant neoplasm of colon: Secondary | ICD-10-CM

## 2023-08-10 DIAGNOSIS — K635 Polyp of colon: Secondary | ICD-10-CM | POA: Diagnosis not present

## 2023-08-10 DIAGNOSIS — K648 Other hemorrhoids: Secondary | ICD-10-CM | POA: Diagnosis not present

## 2023-08-10 MED ORDER — SODIUM CHLORIDE 0.9 % IV SOLN: 500.0000 mL | Freq: Once | INTRAVENOUS | Status: DC

## 2023-08-10 NOTE — Progress Notes (Signed)
Called to room to assist during endoscopic procedure.  Patient ID and intended procedure confirmed with present staff. Received instructions for my participation in the procedure from the performing physician.

## 2023-08-10 NOTE — Progress Notes (Signed)
Report given to PACU, vss

## 2023-08-10 NOTE — Op Note (Signed)
[facility] Patient Name: Penny Williamson Procedure Date: 08/10/2023 10:26 AM MRN: [id number] Endoscopist: Madelyn Brunner [place] , , [id number] Age: 48 Referring MD:  Date of Birth: [birth year] Gender: Female Account #: [id number] Procedure:                Colonoscopy Indications:              High risk colon cancer surveillance: Personal                            history of colonic polyps Medicines:                Monitored Anesthesia Care Procedure:                Pre-Anesthesia Assessment:                           - Prior to the procedure, a History and Physical                            was performed, and patient medications and                            allergies were reviewed. The patient's tolerance of                            previous anesthesia was also reviewed. The risks                            and benefits of the procedure and the sedation                            options and risks were discussed with the patient.                            All questions were answered, and informed consent                            was obtained. Prior Anticoagulants: The patient has                            taken no anticoagulant or antiplatelet agents. ASA                            Grade Assessment: II - A patient with mild systemic                            disease. After reviewing the risks and benefits,                            the patient was deemed in satisfactory condition to                            undergo the procedure.  After obtaining informed consent, the colonoscope                            was passed under direct vision. Throughout the                            procedure, the patient's blood pressure, pulse, and                            oxygen saturations were monitored continuously. The                            Olympus Scope SN [id number] was introduced through the                            anus and advanced to the  the terminal ileum. The                            colonoscopy was performed without difficulty. The                            patient tolerated the procedure well. The quality                            of the bowel preparation was good. The terminal                            ileum, ileocecal valve, appendiceal orifice, and                            rectum were photographed. Scope In: 10:45:04 AM Scope Out: 11:03:20 AM Scope Withdrawal Time: 0 hours 13 minutes 56 seconds  Total Procedure Duration: 0 hours 18 minutes 16 seconds  Findings:                 The terminal ileum appeared normal.                           Localized inflammation characterized by congestion                            (edema), erosions, erythema and friability was                            found in the descending colon. Biopsies were taken                            with a cold forceps for histology.                           Two sessile polyps were found in the sigmoid colon.                            The polyps were 3 to 4 mm in size. These polyps  were removed with a cold snare. Resection and                            retrieval were complete.                           Non-bleeding internal hemorrhoids were found during                            retroflexion. Complications:            No immediate complications. Estimated Blood Loss:     Estimated blood loss was minimal. Impression:               - The examined portion of the ileum was normal.                           - Localized inflammation was found in the                            descending colon. Biopsied.                           - Two 3 to 4 mm polyps in the sigmoid colon,                            removed with a cold snare. Resected and retrieved.                           - Non-bleeding internal hemorrhoids. Recommendation:           - Discharge patient to home (with escort).                           - Await  pathology results.                           - The findings and recommendations were discussed                            with the patient. Dr Particia Lather "Alan Ripper" Leonides Schanz,  08/10/2023 11:15:36 AM

## 2023-08-10 NOTE — Patient Instructions (Addendum)
-  Handout on polyps, hemorrhoids provided -await pathology results -repeat colonoscopy for surveillance recommended. Date to be determined when pathology result become available   -Continue present medications   YOU HAD AN ENDOSCOPIC PROCEDURE TODAY AT THE [facility]:   Refer to the procedure report that was given to you for any specific questions about what was found during the examination.  If the procedure report does not answer your questions, please call your gastroenterologist to clarify.  If you requested that your care partner not be given the details of your procedure findings, then the procedure report has been included in a sealed envelope for you to review at your convenience later.  YOU SHOULD EXPECT: Some feelings of bloating in the abdomen. Passage of more gas than usual.  Walking can help get rid of the air that was put into your GI tract during the procedure and reduce the bloating. If you had a lower endoscopy (such as a colonoscopy or flexible sigmoidoscopy) you may notice spotting of blood in your stool or on the toilet paper. If you underwent a bowel prep for your procedure, you may not have a normal bowel movement for a few days.  Please Note:  You might notice some irritation and congestion in your nose or some drainage.  This is from the oxygen used during your procedure.  There is no need for concern and it should clear up in a day or so.  SYMPTOMS TO REPORT IMMEDIATELY:  Following lower endoscopy (colonoscopy or flexible sigmoidoscopy):  Excessive amounts of blood in the stool  Significant tenderness or worsening of abdominal pains  Swelling of the abdomen that is new, acute  Fever of 100F or higher   For urgent or emergent issues, a gastroenterologist can be reached at any hour by calling [phone]. Do not use MyChart messaging for urgent concerns.    DIET:  We do recommend a small meal at first, but then you may proceed to your regular diet.   Drink plenty of fluids but you should avoid alcoholic beverages for 24 hours.  ACTIVITY:  You should plan to take it easy for the rest of today and you should NOT DRIVE or use heavy machinery until tomorrow (because of the sedation medicines used during the test).    FOLLOW UP: Our staff will call the number listed on your records the next business day following your procedure.  We will call around 7:15- 8:00 am to check on you and address any questions or concerns that you may have regarding the information given to you following your procedure. If we do not reach you, we will leave a message.     If any biopsies were taken you will be contacted by phone or by letter within the next 1-3 weeks.  Please call us at [phone] if you have not heard about the biopsies in 3 weeks.    SIGNATURES/CONFIDENTIALITY: You and/or your care partner have signed paperwork which will be entered into your electronic medical record.  These signatures attest to the fact that that the information above on your After Visit Summary has been reviewed and is understood.  Full responsibility of the confidentiality of this discharge information lies with you and/or your care-partner.

## 2023-08-10 NOTE — Progress Notes (Signed)
GASTROENTEROLOGY PROCEDURE H&P NOTE   Primary Care Physician: Tracey Harries, MD    Reason for Procedure:   History of colon polyps  Plan:    Colonoscopy  Patient is appropriate for endoscopic procedure(s) in the ambulatory ([facility]) setting.  The nature of the procedure, as well as the risks, benefits, and alternatives were carefully and thoroughly reviewed with the patient. Ample time for discussion and questions allowed. The patient understood, was satisfied, and agreed to proceed.     HPI: Penny Williamson is a 48 y.o. female who presents for colonoscopy for history of colon polyps. Denies blood in stools, changes in bowel habits, or unintentional weight loss. Denies family history of colon cancer.  Colonoscopy 08/12/21: Good prep. One 15 mm polyp in the sigmoid colon, removed with hot snare. One 5 mm polyp in the rectum that was removed piecemeal with cold biopsy forceps.  Path: Tubular adenoma with high grade dysplasia in the sigmoid. Hyperplastic polyp with features of mucosal prolapse. Patient stated that she was due in 1 year for follow up colonoscopy.  Past Medical History:  Diagnosis Date   Allergy    Diabetes mellitus without complication (HCC)    Hypercholesteremia    Hypertension    Sickle cell trait (HCC)    Sleep apnea     Past Surgical History:  Procedure Laterality Date   WISDOM TOOTH EXTRACTION      Prior to Admission medications   Medication Sig Start Date End Date Taking? Authorizing Provider  atorvastatin (LIPITOR) 20 MG tablet Take 20 mg by mouth daily. 03/31/20  Yes [provider]  naltrexone (DEPADE) 50 MG tablet Take 50 mg by mouth daily. 07/01/23 06/30/24 Yes [provider]  triamterene-hydrochlorothiazide (DYAZIDE) 37.5-25 MG per capsule Take 1 capsule by mouth daily. 07/02/14  Yes [provider]  metFORMIN (GLUCOPHAGE) 850 MG tablet Take by mouth daily with breakfast. Patient not taking: Reported on 08/10/2023     [provider]  OZEMPIC, 0.25 OR 0.5 MG/DOSE, 2 MG/3ML SOPN Inject into the skin. Patient not taking: Reported on 08/10/2023    [provider]  OZEMPIC, 2 MG/DOSE, 8 MG/3ML SOPN Inject 2 mg into the skin once a week. 07/01/23   [provider]    Current Outpatient Medications  Medication Sig Dispense Refill   atorvastatin (LIPITOR) 20 MG tablet Take 20 mg by mouth daily.     naltrexone (DEPADE) 50 MG tablet Take 50 mg by mouth daily.     triamterene-hydrochlorothiazide (DYAZIDE) 37.5-25 MG per capsule Take 1 capsule by mouth daily.  3   metFORMIN (GLUCOPHAGE) 850 MG tablet Take by mouth daily with breakfast. (Patient not taking: Reported on 08/10/2023)     OZEMPIC, 0.25 OR 0.5 MG/DOSE, 2 MG/3ML SOPN Inject into the skin. (Patient not taking: Reported on 08/10/2023)     OZEMPIC, 2 MG/DOSE, 8 MG/3ML SOPN Inject 2 mg into the skin once a week.     Current Facility-Administered Medications  Medication Dose Route Frequency Provider Last Rate Last Admin   0.9 %  sodium chloride infusion  500 mL Intravenous Once Imogene Burn, MD        Allergies as of 08/10/2023 - Review Complete 08/10/2023  Allergen Reaction Noted   Penicillins  08/08/2012    Family History  Problem Relation Age of Onset   Lupus Mother    Fibromyalgia Mother    Prostate cancer Father    Heart disease Maternal Grandmother    Diabetes Maternal Grandmother  Colon cancer Neg Hx    Rectal cancer Neg Hx    Stomach cancer Neg Hx    Esophageal cancer Neg Hx     Social History   Socioeconomic History   Marital status: Single    Spouse name: Not on file   Number of children: 2   Years of education: Not on file   Highest education level: Associate degree: occupational, Scientist, product/process development, or vocational program  Occupational History   Occupation: [employer]  Tobacco Use   Smoking status: Every Day    Current packs/day: 0.50    Types: Cigarettes   Smokeless tobacco: Never   Tobacco  comments:    Was prescribed Wellbuttrin very recently to help with quitting  Vaping Use   Vaping status: Never Used  Substance and Sexual Activity   Alcohol use: Yes    Alcohol/week: 3.0 standard drinks of alcohol    Types: 3 Standard drinks or equivalent per week   Drug use: No   Sexual activity: Yes    Birth control/protection: None  Other Topics Concern   Not on file  Social History Narrative   Not on file   Social Drivers of Health   Financial Resource Strain: Low Risk  (04/09/2020)   Overall Financial Resource Strain (CARDIA)    Difficulty of Paying Living Expenses: Not very hard  Food Insecurity: Food Insecurity Present (03/18/2023)   Hunger Vital Sign    Worried About Running Out of Food in the Last Year: Sometimes true    Ran Out of Food in the Last Year: Sometimes true  Transportation Needs: No Transportation Needs (03/18/2023)   PRAPARE - Administrator, Civil Service (Medical): No    Lack of Transportation (Non-Medical): No  Physical Activity: Not on file  Stress: Not on file  Social Connections: Unknown (09/14/2022)   Received from [facility]   Social Network    Social Network: Not on file  Intimate Partner Violence: Not At Risk (03/18/2023)   Humiliation, Afraid, Rape, and Kick questionnaire    Fear of Current or Ex-Partner: No    Emotionally Abused: No    Physically Abused: No    Sexually Abused: No    Physical Exam: Vital signs in last 24 hours: BP 120/68   Pulse 74   Temp (!) 97.3 F (36.3 C)   Ht 5\' 6"  (1.676 m)   Wt 173 lb (78.5 kg)   LMP 07/20/2023   SpO2 100%   BMI 27.92 kg/m  GEN: NAD EYE: Sclerae anicteric ENT: MMM CV: Non-tachycardic Pulm: No increased work of breathing GI: Soft, NT/ND NEURO:  Alert & Oriented   Eulah Pont, MD [facility]  08/10/2023 10:26 AM

## 2023-08-10 NOTE — Progress Notes (Signed)
Pt's states no medical or surgical changes since previsit or office visit.

## 2023-08-11 ENCOUNTER — Telehealth: Payer: Self-pay

## 2023-08-11 NOTE — Telephone Encounter (Signed)
  Follow up Call-     08/10/2023   10:17 AM  Call back number  Post procedure Call Back phone  # [phone]  Permission to leave phone message Yes     Left message

## 2023-08-12 ENCOUNTER — Encounter: Payer: Self-pay | Admitting: Internal Medicine

## 2023-08-12 LAB — SURGICAL PATHOLOGY

## 2023-09-08 ENCOUNTER — Other Ambulatory Visit: Payer: Self-pay | Admitting: Physician Assistant

## 2023-09-08 DIAGNOSIS — D696 Thrombocytopenia, unspecified: Secondary | ICD-10-CM

## 2023-09-13 ENCOUNTER — Inpatient Hospital Stay: Payer: Medicaid Other | Admitting: Physician Assistant

## 2023-09-13 ENCOUNTER — Inpatient Hospital Stay: Payer: Medicaid Other | Attending: Physician Assistant
